# Patient Record
Sex: Male | Born: 1960 | Race: White | Hispanic: No | Marital: Married | Smoking: Former smoker
Health system: Southern US, Community
[De-identification: ages and names within clinical notes are randomized; demographics above are authoritative.]

## PROBLEM LIST (undated history)

## (undated) DIAGNOSIS — F419 Anxiety disorder, unspecified: Secondary | ICD-10-CM

## (undated) DIAGNOSIS — F32A Depression, unspecified: Secondary | ICD-10-CM

## (undated) DIAGNOSIS — I509 Heart failure, unspecified: Secondary | ICD-10-CM

---

## 2020-02-06 ENCOUNTER — Ambulatory Visit (INDEPENDENT_AMBULATORY_CARE_PROVIDER_SITE_OTHER): Payer: Self-pay

## 2020-02-06 ENCOUNTER — Encounter: Payer: Self-pay | Admitting: Emergency Medicine

## 2020-02-06 ENCOUNTER — Ambulatory Visit
Admission: EM | Admit: 2020-02-06 | Discharge: 2020-02-06 | Disposition: A | Discharge status: Home / Self Care | Payer: Self-pay

## 2020-02-06 DIAGNOSIS — W540XXA Bitten by dog, initial encounter: Secondary | ICD-10-CM

## 2020-02-06 DIAGNOSIS — L03116 Cellulitis of left lower limb: Secondary | ICD-10-CM

## 2020-02-06 DIAGNOSIS — M79605 Pain in left leg: Secondary | ICD-10-CM

## 2020-02-06 DIAGNOSIS — M25562 Pain in left knee: Secondary | ICD-10-CM

## 2020-02-06 HISTORY — DX: Heart failure, unspecified: I50.9

## 2020-02-06 IMAGING — DX DG KNEE 1-2V*L*
2 series · 2 of 2 positions shown · non-contrast
Comparison: None.

CLINICAL DATA: Dog bite on [REDACTED], infection

EXAM:
LEFT KNEE - 1-2 VIEW

[knee ap]
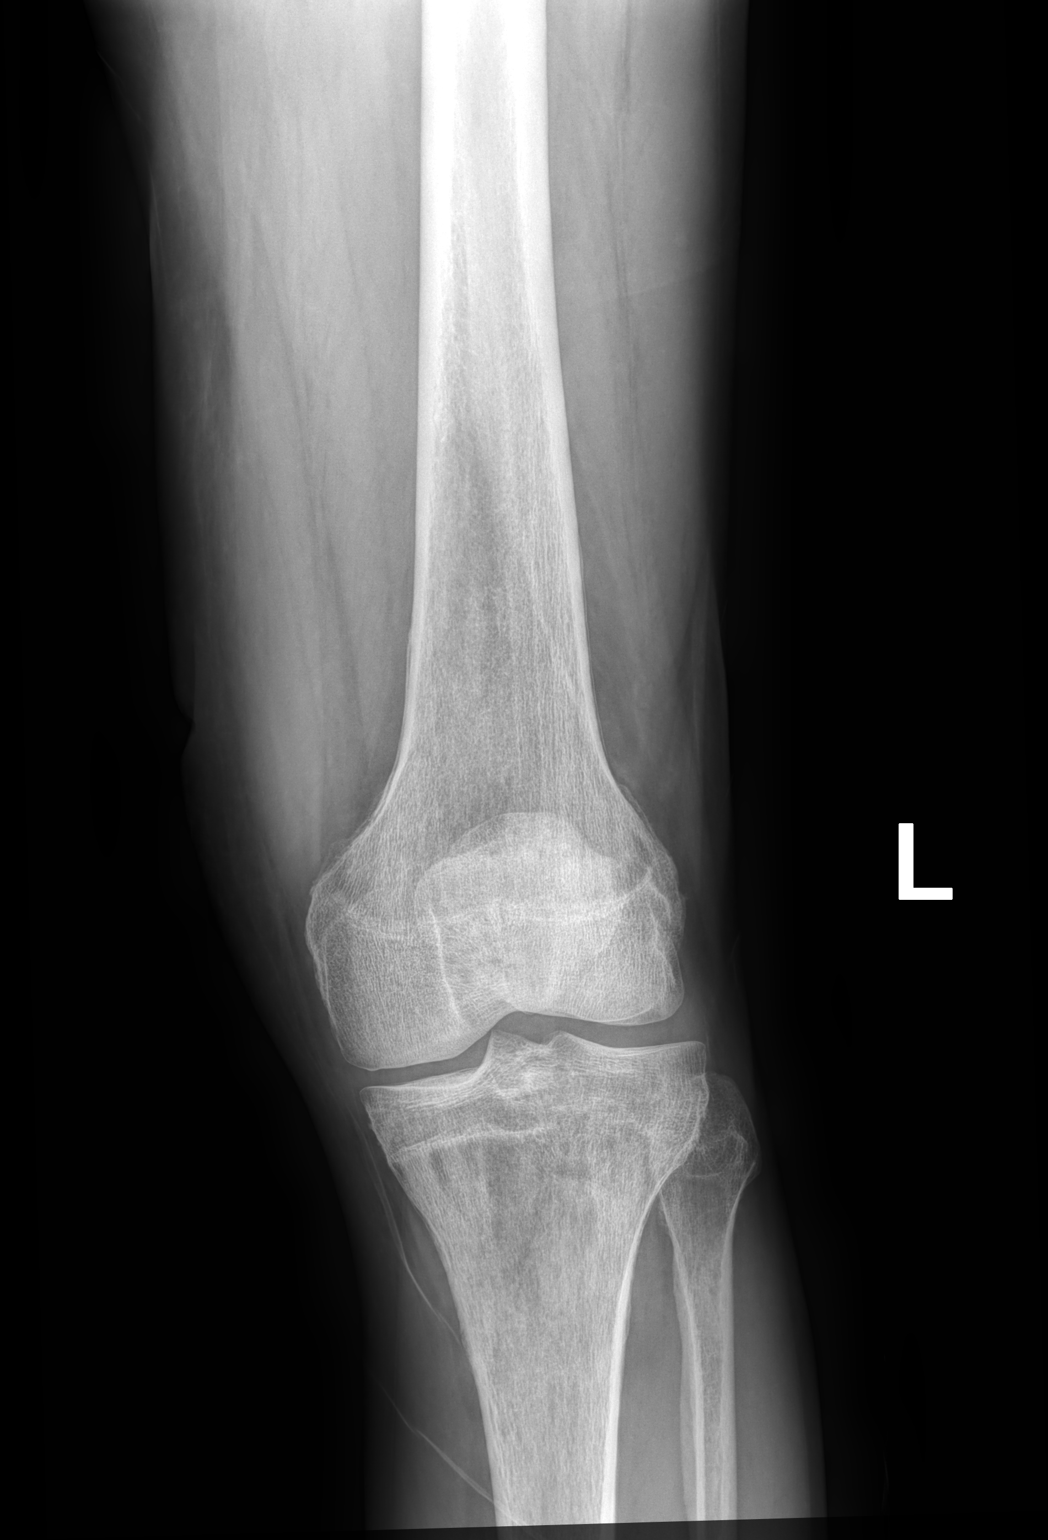

[knee lat]
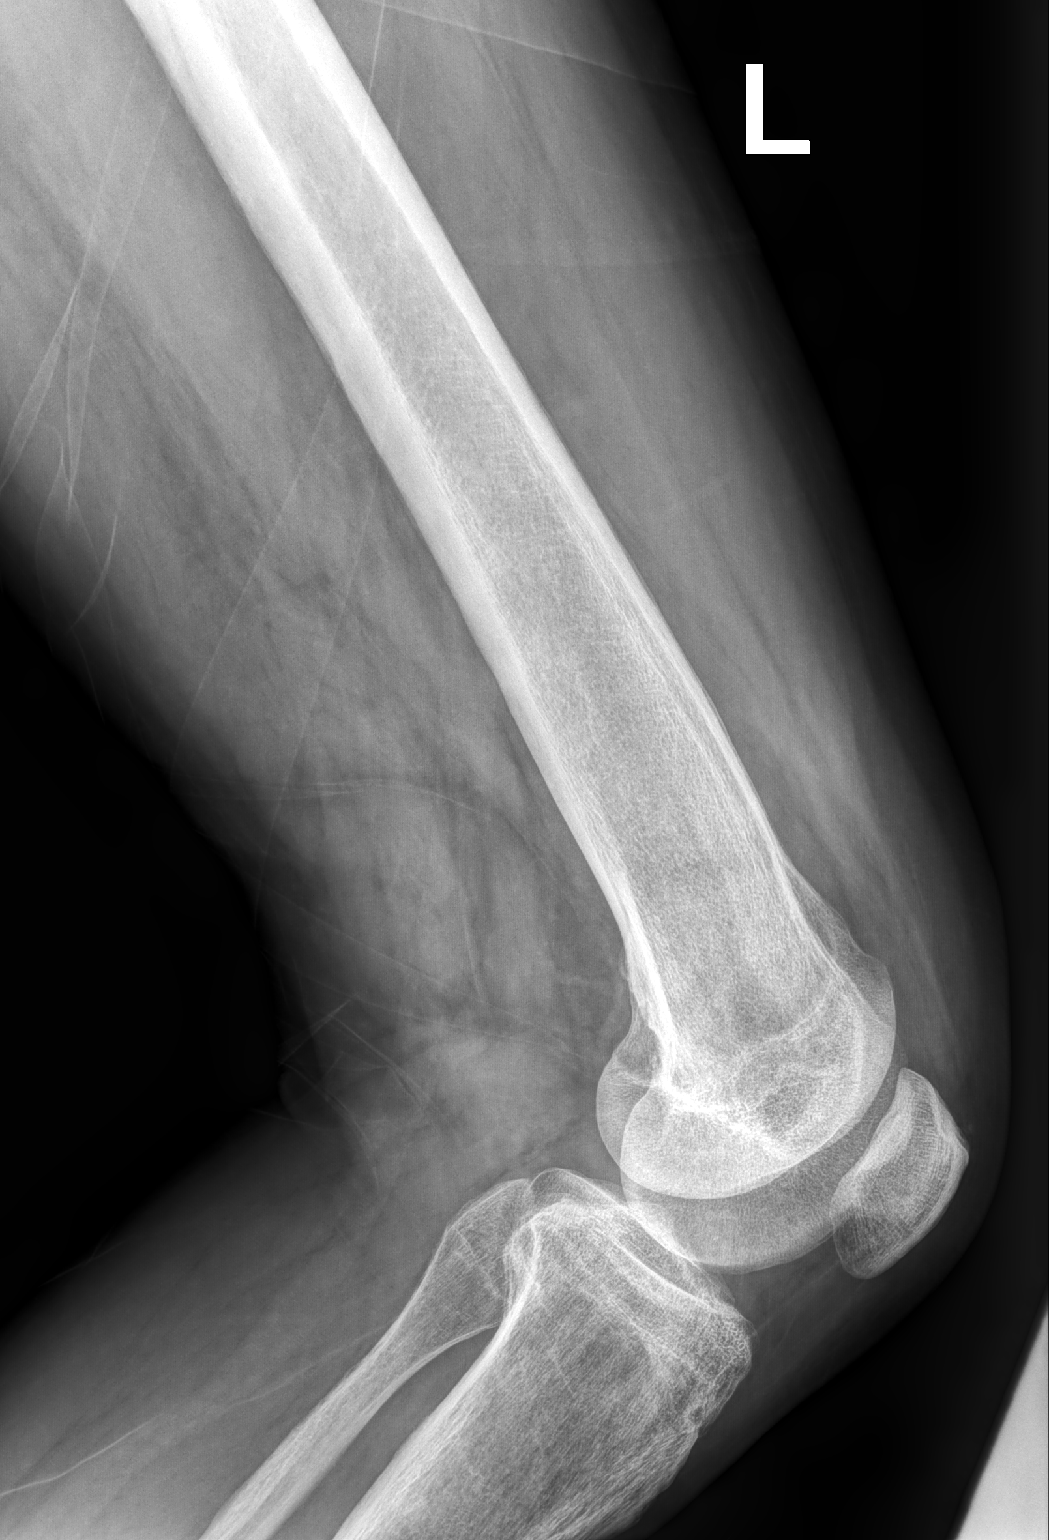

[2 of 2 positions shown; findings below may reference images not displayed]

FINDINGS: Frontal and lateral views of the left knee demonstrate no fracture,
subluxation, or dislocation. Joint spaces are well preserved. No
effusion.

Subcutaneous gas is seen surrounding the distal left femur, most
pronounced posteriorly on the lateral view. No radiopaque foreign
body.
IMPRESSION: 1. Subcutaneous gas and distal left thigh, consistent with history
of penetrating trauma. No radiopaque foreign body.
2. No acute fracture.

## 2020-02-06 MED ORDER — CEFTRIAXONE SODIUM 1 G IJ SOLR
2.0000 g | Freq: Once | INTRAMUSCULAR | Status: AC
  Administered 2020-02-06: 16:00:00 2 g via INTRAMUSCULAR

## 2020-02-06 MED ORDER — AMOXICILLIN-POT CLAVULANATE 875-125 MG PO TABS: 1.0000 | ORAL_TABLET | Freq: Two times a day (BID) | ORAL | 0 refills | Status: AC

## 2020-02-06 NOTE — Discharge Instructions (Addendum)
You have received a large dose of Rocephin in the office today. You should begin to see an improvement in your wound over the next 24 hours.  I have sent in Augmentin for you to take twice a day for 10 days.   If you are not improving by Sunday morning, follow up in this office for additional antibiotic injection.  If you are getting worse, you develop a fever, red streaking up your leg, chills, shortness of breath or other concerning symptoms, I want you to go to the ER for further evaluation and treatment.

## 2020-02-06 NOTE — ED Provider Notes (Signed)
[facility]    CSN: [id number] Arrival date & time: 02/06/20  1353      History   Chief Complaint Chief Complaint  Patient presents with  . Wound Infection    HPI Todd [name] is a 59 y.o. male.    Todd [name] is a 59 y.o. male who presents with a skin complaint that began x 5 days ago. Reports that he was bitten by his dog. Reports that the bite is located above the L knee. Reports that the area is red, hot and painful. Reports that the wound is draining yellow and green fluid, has some black debris in the middle, and has a foul smell. Has tried cleaning the wound at home this week with saline, dermoplast, neosporin creams. Has tried to keep the area clean and dry with little success. There are no aggravating or alleviating factors. Denies fever, chills, nausea, vomiting, oral lesions, SOB, chest pain, abdominal pain, changes in bowel or bladder function.    ROS per HPI   The history is provided by the patient.    Past Medical History:  Diagnosis Date  . CHF (congestive heart failure) (HCC)     There are no problems to display for this patient.   History reviewed. No pertinent surgical history.     Home Medications    Prior to Admission medications   Medication Sig Start Date End Date Taking? Authorizing Provider  amoxicillin-clavulanate (AUGMENTIN) 875-125 MG tablet Take 1 tablet by mouth 2 (two) times daily for 10 days. 02/06/20 02/16/20  Moshe Cipro, NP  aspirin EC 81 MG tablet Take 81 mg by mouth daily. Swallow whole.    [provider]  finasteride (PROSCAR) 5 MG tablet Take 5 mg by mouth daily.    [provider]  losartan (COZAAR) 25 MG tablet Take 25 mg by mouth daily.    [provider]  metoprolol tartrate (LOPRESSOR) 50 MG tablet Take 50 mg by mouth 2 (two) times daily.    [provider]  spironolactone (ALDACTONE) 25 MG tablet Take 25 mg by mouth daily.    [provider]   torsemide (DEMADEX) 20 MG tablet Take 20 mg by mouth daily.    [provider]    Family History No family history on file.  Social History Social History   Tobacco Use  . Smoking status: Former Games developer  . Smokeless tobacco: Never Used  Substance Use Topics  . Alcohol use: Never  . Drug use: Never     Allergies   Patient has no known allergies.   Review of Systems Review of Systems   Physical Exam Triage Vital Signs ED Triage Vitals  Enc Vitals Group     BP 02/06/20 1420 116/79     Pulse Rate 02/06/20 1420 87     Resp 02/06/20 1420 18     Temp 02/06/20 1420 98.4 F (36.9 C)     Temp Source 02/06/20 1420 Oral     SpO2 02/06/20 1420 94 %     Weight 02/06/20 1416 190 lb (86.2 kg)     Height 02/06/20 1416 5\' 11"  (1.803 m)     Head Circumference --      Peak Flow --      Pain Score 02/06/20 1416 0     Pain Loc --      Pain Edu? --      Excl. in [place]? --    No data found.  Updated Vital Signs BP 116/79 (BP  Location: Right Arm)   Pulse 87   Temp 98.4 F (36.9 C) (Oral)   Resp 18   Ht 5\' 11"  (1.803 m)   Wt 290 lb (131.5 kg)   SpO2 94%   BMI 40.45 kg/m   Visual Acuity Right Eye Distance:   Left Eye Distance:   Bilateral Distance:    Right Eye Near:   Left Eye Near:    Bilateral Near:     Physical Exam Vitals and nursing note reviewed.  Constitutional:      General: He is not in acute distress.    Appearance: Normal appearance. He is well-developed and well-nourished. He is not ill-appearing.  HENT:     Head: Normocephalic and atraumatic.  Eyes:     Conjunctiva/sclera: Conjunctivae normal.  Cardiovascular:     Rate and Rhythm: Normal rate and regular rhythm.     Heart sounds: Normal heart sounds. No murmur heard.   Pulmonary:     Effort: Pulmonary effort is normal. No respiratory distress.     Breath sounds: Normal breath sounds.  Abdominal:     General: Bowel sounds are normal.     Palpations: Abdomen is soft.     Tenderness:  There is no abdominal tenderness.  Musculoskeletal:        General: No edema. Normal range of motion.     Cervical back: Normal range of motion and neck supple.  Skin:    General: Skin is warm and dry.     Capillary Refill: Capillary refill takes less than 2 seconds.     Findings: Wound present.       Neurological:     General: No focal deficit present.     Mental Status: He is alert and oriented to person, place, and time. Mental status is at baseline.  Psychiatric:        Mood and Affect: Mood and affect and mood normal.        Behavior: Behavior normal.        Thought Content: Thought content normal.      UC Treatments / Results  Labs (all labs ordered are listed, but only abnormal results are displayed) Labs Reviewed - No data to display  EKG   Radiology No results found.  Procedures Wound Care  Date/Time: 02/06/2020 2:55 PM Performed by: Faustino Congress, NP Authorized by: Faustino Congress, NP   Consent:    Consent obtained:  Verbal   Consent given by:  Patient   Risks, benefits, and alternatives were discussed: yes     Risks discussed:  Infection and pain   Alternatives discussed:  Alternative treatment Sedation:    Sedation type:  None Anesthesia:    Anesthesia method:  Topical application   Topical anesthetic:  LET Procedure details:    Indications: open wounds     Wound location:  Leg   Leg location:  L upper leg   Wound age (days):  5   Wound surface area (sq cm):  3   Debridement performed: Yes     Debridement type: selective     Debridement level: subcutaneous tissue     Debridement mechanism:  Forceps   Devitalized tissue debrided: slough   Dressing:    Dressing applied:  4x4 Post-procedure details:    Procedure completion:  Tolerated well, no immediate complications   (including critical care time)  Medications Ordered in UC Medications  cefTRIAXone (ROCEPHIN) injection 2 g (2 g Intramuscular Given 02/06/20 1612)    Initial  Impression /  Assessment and Plan / UC Course  I have reviewed the triage vital signs and the nursing notes.  Pertinent labs & imaging results that were available during my care of the patient were reviewed by me and considered in my medical decision making (see chart for details).     Left leg pain Cellulitis Dog bite  X-ray negative for osteomyelitis Attempted to debride wound in office today Minimal slough removed Patient tolerated very well 2 g of Rocephin IM given in office Prescribed Augmentin 875 twice daily x10 days Discussed with patient that if the area is not getting better over the next 24 hours, he needs to follow-up in the ER Discussed with patient that if the area is beginning to have red streaking up his leg, if the redness is swelling, if he develops fever, malaise, body aches, chills, he needs to follow-up in the ER as well If things are not getting worse, but are not really changing by Sunday when this office is open, he may follow-up here for reevaluation   Final Clinical Impressions(s) / UC Diagnoses   Final diagnoses:  Left leg pain  Cellulitis of left lower extremity  Dog bite, initial encounter     Discharge Instructions     You have received a large dose of Rocephin in the office today. You should begin to see an improvement in your wound over the next 24 hours.  I have sent in Augmentin for you to take twice a day for 10 days.   If you are not improving by Sunday morning, follow up in this office for additional antibiotic injection.  If you are getting worse, you develop a fever, red streaking up your leg, chills, shortness of breath or other concerning symptoms, I want you to go to the ER for further evaluation and treatment.    ED Prescriptions    Medication Sig Dispense Auth. Provider   amoxicillin-clavulanate (AUGMENTIN) 875-125 MG tablet Take 1 tablet by mouth 2 (two) times daily for 10 days. 20 tablet Faustino Congress, NP     PDMP not  reviewed this encounter.   Faustino Congress, NP 02/08/20 1523

## 2020-02-06 NOTE — ED Triage Notes (Signed)
Dog bite above LT knee from his dog.  Dog is up to date on rabies vaccine. Pt states wound is red, has pus draining and has foul smell

## 2020-12-03 ENCOUNTER — Other Ambulatory Visit: Payer: Self-pay

## 2020-12-03 ENCOUNTER — Ambulatory Visit
Admission: RE | Admit: 2020-12-03 | Discharge: 2020-12-03 | Disposition: A | Discharge status: Home / Self Care | Payer: Self-pay | Source: Ambulatory Visit | Attending: Family Medicine | Admitting: Family Medicine

## 2020-12-03 VITALS — BP 152/84 | HR 96 | Temp 98.9°F

## 2020-12-03 DIAGNOSIS — F411 Generalized anxiety disorder: Secondary | ICD-10-CM

## 2020-12-03 DIAGNOSIS — I10 Essential (primary) hypertension: Secondary | ICD-10-CM

## 2020-12-03 MED ORDER — LOSARTAN POTASSIUM 25 MG PO TABS: 25.0000 mg | ORAL_TABLET | Freq: Every day | ORAL | 2 refills | Status: DC

## 2020-12-03 MED ORDER — METOPROLOL SUCCINATE ER 50 MG PO TB24: 50.0000 mg | ORAL_TABLET | Freq: Every day | ORAL | 2 refills | Status: DC

## 2020-12-03 NOTE — ED Provider Notes (Signed)
[facility]    CSN: [id number] Arrival date & time: 12/03/20  9983      History   Chief Complaint Chief Complaint  Patient presents with   Medication Refill    HPI Todd [name] is a 60 y.o. male.   Patient presenting today requesting a blood pressure check and medication refills.  He states he is been out of his blood pressure medications for about a month and a half now.  Overall feeling well without chest pain, shortness of breath, leg swelling, visual changes, headaches and has not been monitoring his blood pressures at home.  States he is been on losartan and metoprolol for quite some time, cannot get into his primary care provider until the end of the year.  Has been under a lot of stress recently with worsening anxiety/panic attacks, recent death of his dog of 15 years, recent move to the area.  Feels like he has been having paranoia and anxiety attacks consistently, wife states that he cannot go outside by himself and has to take frequent moments of quiet breathing to help himself calm down from these episodes.   Past Medical History:  Diagnosis Date   CHF (congestive heart failure) (HCC)     There are no problems to display for this patient.   History reviewed. No pertinent surgical history.     Home Medications    Prior to Admission medications   Medication Sig Start Date End Date Taking? Authorizing Provider  metoprolol succinate (TOPROL XL) 50 MG 24 hr tablet Take 1 tablet (50 mg total) by mouth daily. Take with or immediately following a meal. 12/03/20  Yes Volney American, PA-C  aspirin EC 81 MG tablet Take 81 mg by mouth daily. Swallow whole.    [provider]  finasteride (PROSCAR) 5 MG tablet Take 5 mg by mouth daily.    [provider]  losartan (COZAAR) 25 MG tablet Take 1 tablet (25 mg total) by mouth daily. 12/03/20   Volney American, PA-C  metoprolol tartrate (LOPRESSOR) 50 MG tablet Take 50 mg by mouth  2 (two) times daily.    [provider]  spironolactone (ALDACTONE) 25 MG tablet Take 25 mg by mouth daily.    [provider]  torsemide (DEMADEX) 20 MG tablet Take 20 mg by mouth daily.    [provider]    Family History History reviewed. No pertinent family history.  Social History Social History   Tobacco Use   Smoking status: Former   Smokeless tobacco: Never  Substance Use Topics   Alcohol use: Never   Drug use: Never     Allergies   Patient has no known allergies.   Review of Systems Review of Systems Per HPI  Physical Exam Triage Vital Signs ED Triage Vitals  Enc Vitals Group     BP 12/03/20 1019 (!) 152/84     Pulse Rate 12/03/20 1019 96     Resp --      Temp 12/03/20 1019 98.9 F (37.2 C)     Temp src --      SpO2 12/03/20 1019 94 %     Weight --      Height --      Head Circumference --      Peak Flow --      Pain Score 12/03/20 1016 0     Pain Loc --      Pain Edu? --      Excl. in [place]? --  No data found.  Updated Vital Signs BP (!) 152/84   Pulse 96   Temp 98.9 F (37.2 C)   SpO2 94%   Visual Acuity Right Eye Distance:   Left Eye Distance:   Bilateral Distance:    Right Eye Near:   Left Eye Near:    Bilateral Near:     Physical Exam Vitals and nursing note reviewed.  Constitutional:      Appearance: Normal appearance.  HENT:     Head: Atraumatic.     Mouth/Throat:     Mouth: Mucous membranes are moist.  Eyes:     Extraocular Movements: Extraocular movements intact.     Conjunctiva/sclera: Conjunctivae normal.  Cardiovascular:     Rate and Rhythm: Normal rate and regular rhythm.  Pulmonary:     Effort: Pulmonary effort is normal. No respiratory distress.     Breath sounds: Normal breath sounds. No wheezing or rales.  Musculoskeletal:        General: No swelling. Normal range of motion.     Cervical back: Normal range of motion and neck supple.  Skin:    General: Skin is warm and dry.   Neurological:     General: No focal deficit present.     Mental Status: He is oriented to person, place, and time.     Motor: No weakness.     Gait: Gait normal.  Psychiatric:        Thought Content: Thought content normal.        Judgment: Judgment normal.     Comments: Appears anxious     UC Treatments / Results  Labs (all labs ordered are listed, but only abnormal results are displayed) Labs Reviewed - No data to display  EKG   Radiology No results found.  Procedures Procedures (including critical care time)  Medications Ordered in UC Medications - No data to display  Initial Impression / Assessment and Plan / UC Course  I have reviewed the triage vital signs and the nursing notes.  Pertinent labs & imaging results that were available during my care of the patient were reviewed by me and considered in my medical decision making (see chart for details).     We will restart losartan, metoprolol until he can see primary care provider for further evaluation, lab monitoring and recheck.  Discussed importance of monitoring home blood pressures to ensure under good control and to follow-up immediately with side effects, issues in the meantime.  Regarding his anxiousness, discussed to follow-up with primary care provider regarding worsening symptoms with this and to continue with breathing exercises, physical activity and possibly look into counseling in the meantime.  Return for acutely worsening symptoms.  Final Clinical Impressions(s) / UC Diagnoses   Final diagnoses:  Essential hypertension  Anxiety state   Discharge Instructions   None    ED Prescriptions     Medication Sig Dispense Auth. Provider   losartan (COZAAR) 25 MG tablet Take 1 tablet (25 mg total) by mouth daily. 30 tablet Volney American, [place]   metoprolol succinate (TOPROL XL) 50 MG 24 hr tablet Take 1 tablet (50 mg total) by mouth daily. Take with or immediately following a meal. 30 tablet  Volney American, [place]      PDMP not reviewed this encounter.   Volney American, [place] 12/03/20 1104

## 2020-12-03 NOTE — ED Triage Notes (Signed)
Pt is present today with for medication refill and blood pressure check. Pt states that he is also feeling nervous and paranoid. Pt thinks its due to the recent loss of his dog and moving to a new state

## 2021-03-01 ENCOUNTER — Other Ambulatory Visit: Payer: Self-pay | Admitting: Family Medicine

## 2021-03-01 NOTE — Telephone Encounter (Signed)
Requested Prescriptions  Refused Prescriptions Disp Refills   losartan (COZAAR) 25 MG tablet [Pharmacy Med Name: Losartan Potassium 25 MG Oral Tablet] 30 tablet 0    Sig: Take 1 tablet by mouth once daily     Cardiovascular:  Angiotensin Receptor Blockers Failed - 03/01/2021 12:28 PM      Failed - Cr in normal range and within 180 days    No results found for: CREATININE, LABCREAU, LABCREA, POCCRE       Failed - K in normal range and within 180 days    No results found for: K, POTASSIUM, POCK       Failed - Last BP in normal range    BP Readings from Last 1 Encounters:  12/03/20 (!) 152/84         Failed - Valid encounter within last 6 months    Recent Outpatient Visits   None     Future Appointments            In 2 weeks Lindell Spar, MD [place], [place] - Patient is not pregnant       metoprolol succinate (TOPROL-XL) 50 MG 24 hr tablet [Pharmacy Med Name: Metoprolol Succinate ER 50 MG Oral Tablet Extended Release 24 Hour] 90 tablet 0    Sig: TAKE 1 TABLET BY MOUTH ONCE DAILY. TAKE WITH OR IMMEDIATELY FOLLOWING A MEAL     Cardiovascular:  Beta Blockers Failed - 03/01/2021 12:28 PM      Failed - Last BP in normal range    BP Readings from Last 1 Encounters:  12/03/20 (!) 152/84         Failed - Valid encounter within last 6 months    Recent Outpatient Visits   None     Future Appointments            In 2 weeks Lindell Spar, MD [facility]           Passed - Last Heart Rate in normal range    Pulse Readings from Last 1 Encounters:  12/03/20 96

## 2021-03-03 ENCOUNTER — Other Ambulatory Visit: Payer: Self-pay | Admitting: Family Medicine

## 2021-03-07 ENCOUNTER — Other Ambulatory Visit: Payer: Self-pay

## 2021-03-07 ENCOUNTER — Ambulatory Visit
Admission: EM | Admit: 2021-03-07 | Discharge: 2021-03-07 | Disposition: A | Discharge status: Home / Self Care | Payer: Self-pay | Attending: Family Medicine | Admitting: Family Medicine

## 2021-03-07 DIAGNOSIS — I10 Essential (primary) hypertension: Secondary | ICD-10-CM

## 2021-03-07 MED ORDER — METOPROLOL SUCCINATE ER 50 MG PO TB24: 50.0000 mg | ORAL_TABLET | Freq: Every day | ORAL | 0 refills | Status: DC

## 2021-03-07 MED ORDER — LOSARTAN POTASSIUM 25 MG PO TABS: 25.0000 mg | ORAL_TABLET | Freq: Every day | ORAL | 0 refills | Status: DC

## 2021-03-07 NOTE — ED Triage Notes (Signed)
Patient states he needs Blood Pressure Refill  Patient states he has an appointment in 2 weeks

## 2021-03-07 NOTE — ED Provider Notes (Signed)
[facility]    CSN: [id number] Arrival date & time: 03/07/21  1049      History   Chief Complaint Chief Complaint  Patient presents with   Medication Refill    HPI Todd [name] is a 61 y.o. male.   Presenting today requesting a medication refill on metoprolol and losartan for his essential hypertension.  He states his blood pressures have been well controlled in the 120s over 80s range he has tolerated the medications well without side effects.  Denies chest pain, shortness of breath, headaches, dizziness, visual changes.  Has an establish care visit scheduled for 2 weeks from now with a new primary care provider in the area and just needs a refill until then.  No new questions or concerns today.   Past Medical History:  Diagnosis Date   CHF (congestive heart failure) (HCC)     There are no problems to display for this patient.   History reviewed. No pertinent surgical history.     Home Medications    Prior to Admission medications   Medication Sig Start Date End Date Taking? Authorizing Provider  aspirin EC 81 MG tablet Take 81 mg by mouth daily. Swallow whole.    [provider]  finasteride (PROSCAR) 5 MG tablet Take 5 mg by mouth daily.    [provider]  losartan (COZAAR) 25 MG tablet Take 1 tablet (25 mg total) by mouth daily. 03/07/21   Volney American, PA-C  metoprolol succinate (TOPROL XL) 50 MG 24 hr tablet Take 1 tablet (50 mg total) by mouth daily. Take with or immediately following a meal. 03/07/21   Volney American, PA-C  metoprolol tartrate (LOPRESSOR) 50 MG tablet Take 50 mg by mouth 2 (two) times daily.    [provider]  spironolactone (ALDACTONE) 25 MG tablet Take 25 mg by mouth daily.    [provider]  torsemide (DEMADEX) 20 MG tablet Take 20 mg by mouth daily.    [provider]    Family History Family History  Problem Relation Age of Onset   Cancer Mother    Dementia  Father     Social History Social History   Tobacco Use   Smoking status: Former   Smokeless tobacco: Never  Scientific laboratory technician Use: Every day   Substances: Nicotine, Flavoring  Substance Use Topics   Alcohol use: Never   Drug use: Never     Allergies   Patient has no known allergies.   Review of Systems Review of Systems Per HPI  Physical Exam Triage Vital Signs ED Triage Vitals  Enc Vitals Group     BP 03/07/21 1151 126/81     Pulse Rate 03/07/21 1151 82     Resp 03/07/21 1151 18     Temp 03/07/21 1151 99.1 F (37.3 C)     Temp Source 03/07/21 1151 Oral     SpO2 03/07/21 1151 95 %     Weight --      Height --      Head Circumference --      Peak Flow --      Pain Score 03/07/21 1147 0     Pain Loc --      Pain Edu? --      Excl. in [place]? --    No data found.  Updated Vital Signs BP 126/81 (BP Location: Right Arm)    Pulse 82    Temp 99.1 F (37.3 C) (Oral)  Resp 18    SpO2 95%   Visual Acuity Right Eye Distance:   Left Eye Distance:   Bilateral Distance:    Right Eye Near:   Left Eye Near:    Bilateral Near:     Physical Exam Vitals and nursing note reviewed.  Constitutional:      Appearance: Normal appearance.  HENT:     Head: Atraumatic.     Mouth/Throat:     Mouth: Mucous membranes are moist.  Eyes:     Extraocular Movements: Extraocular movements intact.     Conjunctiva/sclera: Conjunctivae normal.  Cardiovascular:     Rate and Rhythm: Normal rate and regular rhythm.  Pulmonary:     Effort: Pulmonary effort is normal.     Breath sounds: Normal breath sounds. No wheezing or rales.  Musculoskeletal:        General: Normal range of motion.     Cervical back: Normal range of motion and neck supple.  Skin:    General: Skin is warm and dry.  Neurological:     General: No focal deficit present.     Mental Status: He is oriented to person, place, and time.  Psychiatric:        Mood and Affect: Mood normal.        Thought Content:  Thought content normal.        Judgment: Judgment normal.   UC Treatments / Results  Labs (all labs ordered are listed, but only abnormal results are displayed) Labs Reviewed - No data to display  EKG   Radiology No results found.  Procedures Procedures (including critical care time)  Medications Ordered in UC Medications - No data to display  Initial Impression / Assessment and Plan / UC Course  I have reviewed the triage vital signs and the nursing notes.  Pertinent labs & imaging results that were available during my care of the patient were reviewed by me and considered in my medical decision making (see chart for details).     Vitals and exam benign and reassuring today, tolerating the medications well with good control of her blood pressures.  Will refill both medications until he can follow-up with his new primary care provider.  Return for any worsening symptoms at any time.  Final Clinical Impressions(s) / UC Diagnoses   Final diagnoses:  Essential hypertension   Discharge Instructions   None    ED Prescriptions     Medication Sig Dispense Auth. Provider   losartan (COZAAR) 25 MG tablet Take 1 tablet (25 mg total) by mouth daily. 30 tablet Volney American, [place]   metoprolol succinate (TOPROL XL) 50 MG 24 hr tablet Take 1 tablet (50 mg total) by mouth daily. Take with or immediately following a meal. 30 tablet Volney American, [place]      PDMP not reviewed this encounter.   Volney American, [place] 03/07/21 1226

## 2021-03-21 ENCOUNTER — Encounter: Payer: Self-pay | Admitting: Internal Medicine

## 2021-03-21 ENCOUNTER — Other Ambulatory Visit: Payer: Self-pay

## 2021-03-21 ENCOUNTER — Ambulatory Visit (INDEPENDENT_AMBULATORY_CARE_PROVIDER_SITE_OTHER): Payer: Self-pay | Admitting: Internal Medicine

## 2021-03-21 VITALS — BP 124/82 | HR 90 | Resp 18 | Ht 70.0 in | Wt 161.0 lb

## 2021-03-21 DIAGNOSIS — F1021 Alcohol dependence, in remission: Secondary | ICD-10-CM

## 2021-03-21 DIAGNOSIS — Z114 Encounter for screening for human immunodeficiency virus [HIV]: Secondary | ICD-10-CM

## 2021-03-21 DIAGNOSIS — F321 Major depressive disorder, single episode, moderate: Secondary | ICD-10-CM

## 2021-03-21 DIAGNOSIS — Z1159 Encounter for screening for other viral diseases: Secondary | ICD-10-CM

## 2021-03-21 DIAGNOSIS — I509 Heart failure, unspecified: Secondary | ICD-10-CM

## 2021-03-21 DIAGNOSIS — I10 Essential (primary) hypertension: Secondary | ICD-10-CM | POA: Insufficient documentation

## 2021-03-21 DIAGNOSIS — Z2821 Immunization not carried out because of patient refusal: Secondary | ICD-10-CM

## 2021-03-21 DIAGNOSIS — E782 Mixed hyperlipidemia: Secondary | ICD-10-CM

## 2021-03-21 DIAGNOSIS — R7303 Prediabetes: Secondary | ICD-10-CM

## 2021-03-21 MED ORDER — LOSARTAN POTASSIUM 25 MG PO TABS: 25.0000 mg | ORAL_TABLET | Freq: Every day | ORAL | 1 refills | Status: DC

## 2021-03-21 MED ORDER — METOPROLOL SUCCINATE ER 50 MG PO TB24: 50.0000 mg | ORAL_TABLET | Freq: Every day | ORAL | 1 refills | Status: DC

## 2021-03-21 MED ORDER — ESCITALOPRAM OXALATE 5 MG PO TABS: 5.0000 mg | ORAL_TABLET | Freq: Every day | ORAL | 1 refills | Status: DC

## 2021-03-21 NOTE — Patient Instructions (Addendum)
Please continue taking medications as prescribed.  Please continue to follow low salt diet and ambulate as tolerated.  Please get fasting blood tests within a week.

## 2021-03-25 DIAGNOSIS — F321 Major depressive disorder, single episode, moderate: Secondary | ICD-10-CM | POA: Insufficient documentation

## 2021-03-25 NOTE — Progress Notes (Signed)
New Patient Office Visit  Subjective:  Patient ID: Todd [name], male    DOB: [birth year]  Age: 61 y.o. MRN: [id number]  CC:  Chief Complaint  Patient presents with   New Patient (Initial Visit)    New patient just moved here about 1 year ago has depression and anxiety on and off this has been going on for about 3 months was on medication but quit going to dr in diff state    HPI Todd [name] is a 61 y.o. male with past medical history of HTN, CHF and depression with anxiety who presents for establishing care.  His wife is present during the visit.  They have moved from [place].  HTN and CHF: He was diagnosed with acute CHF when he was in [place].  He was having exertional dyspnea and leg swelling, and was placed on losartan, metoprolol, Aldactone and torsemide for it.  He currently takes only losartan and metoprolol since moving to [place].  He was given losartan and metoprolol from ER/Urgent care.  He currently denies any dyspnea or leg swelling.  Denies any headache, dizziness or chest pain currently.  He was told that his CHF was due to his alcohol dependence, and he has stopped drinking alcohol since then.  Depression with anxiety: He lost his cat recently, and has been having anhedonia and severe anxiety since then.  He does not let his wife go to work due to severe anxiety while he is alone at home.  He also has stopped going outdoors.  He denies any SI or HI currently.  Past Medical History:  Diagnosis Date   CHF (congestive heart failure) ([place])     History reviewed. No pertinent surgical history.  Family History  Problem Relation Age of Onset   Cancer Mother    Dementia Father     Social History   Socioeconomic History   Marital status: Married    Spouse name: Not on file   Number of children: Not on file   Years of education: Not on file   Highest education level: Not on file  Occupational History   Not on file  Tobacco Use   Smoking status: Former   Smokeless tobacco:  Never  Vaping Use   Vaping Use: Every day   Substances: Nicotine, Flavoring  Substance and Sexual Activity   Alcohol use: Never   Drug use: Never   Sexual activity: Yes    Birth control/protection: None  Other Topics Concern   Not on file  Social History Narrative   Not on file   Social Determinants of Health   Financial Resource Strain: Not on file  Food Insecurity: Not on file  Transportation Needs: Not on file  Physical Activity: Not on file  Stress: Not on file  Social Connections: Not on file  Intimate Partner Violence: Not on file    ROS Review of Systems  Constitutional:  Negative for chills and fever.  HENT:  Negative for congestion and sore throat.   Eyes:  Negative for pain and discharge.  Respiratory:  Negative for cough and shortness of breath.   Cardiovascular:  Negative for chest pain and palpitations.  Gastrointestinal:  Negative for diarrhea, nausea and vomiting.  Endocrine: Negative for polydipsia and polyuria.  Genitourinary:  Negative for dysuria and hematuria.  Musculoskeletal:  Negative for neck pain and neck stiffness.  Skin:  Negative for rash.  Neurological:  Negative for dizziness, weakness, numbness and headaches.  Psychiatric/Behavioral:  Positive for decreased concentration, dysphoric mood  and sleep disturbance. Negative for agitation and behavioral problems. The patient is nervous/anxious.    Objective:   Today's Vitals: BP 124/82 (BP Location: Right Arm, Patient Position: Sitting, Cuff Size: Normal)    Pulse 90    Resp 18    Ht 5\' 10"  (1.778 m)    Wt 161 lb (73 kg)    SpO2 97%    BMI 23.10 kg/m   Physical Exam Vitals reviewed.  Constitutional:      General: He is not in acute distress.    Appearance: He is not diaphoretic.  HENT:     Head: Normocephalic and atraumatic.     Nose: Nose normal.     Mouth/Throat:     Mouth: Mucous membranes are moist.  Eyes:     General: No scleral icterus.    Extraocular Movements: Extraocular  movements intact.  Cardiovascular:     Rate and Rhythm: Normal rate and regular rhythm.     Pulses: Normal pulses.     Heart sounds: Normal heart sounds. No murmur heard. Pulmonary:     Breath sounds: Normal breath sounds. No wheezing or rales.  Abdominal:     Palpations: Abdomen is soft.     Tenderness: There is no abdominal tenderness.  Musculoskeletal:     Cervical back: Neck supple. No tenderness.     Right lower leg: No edema.     Left lower leg: No edema.  Skin:    General: Skin is warm.     Findings: No rash.  Neurological:     General: No focal deficit present.     Mental Status: He is alert and oriented to person, place, and time.  Psychiatric:        Mood and Affect: Mood is depressed. Affect is tearful.        Behavior: Behavior is slowed.    Assessment & Plan:   Problem List Items Addressed This Visit       Cardiovascular and Mediastinum   Essential hypertension - Primary   Relevant Medications   losartan (COZAAR) 25 MG tablet   metoprolol succinate (TOPROL XL) 50 MG 24 hr tablet   Other Relevant Orders   CMP14+EGFR   CHF (congestive heart failure) (HCC)   Relevant Medications   losartan (COZAAR) 25 MG tablet   metoprolol succinate (TOPROL XL) 50 MG 24 hr tablet   Other Relevant Orders   Ambulatory referral to Cardiology     Other   Alcohol dependence in remission [facility])   Other Visit Diagnoses     Influenza vaccine refused       Need for hepatitis C screening test       Relevant Orders   Hepatitis C Antibody   Encounter for screening for HIV       Relevant Orders   HIV antibody (with reflex)   Mixed hyperlipidemia       Relevant Medications   losartan (COZAAR) 25 MG tablet   metoprolol succinate (TOPROL XL) 50 MG 24 hr tablet   Other Relevant Orders   Lipid Profile   Prediabetes       Relevant Orders   Hemoglobin A1c   Current moderate episode of major depressive disorder without prior episode (HCC)       Relevant Medications    escitalopram (LEXAPRO) 5 MG tablet       Outpatient Encounter Medications as of 03/21/2021  Medication Sig   aspirin EC 81 MG tablet Take 81 mg by mouth daily. Swallow whole.  escitalopram (LEXAPRO) 5 MG tablet Take 1 tablet (5 mg total) by mouth daily.   [DISCONTINUED] losartan (COZAAR) 25 MG tablet Take 1 tablet (25 mg total) by mouth daily.   [DISCONTINUED] metoprolol succinate (TOPROL XL) 50 MG 24 hr tablet Take 1 tablet (50 mg total) by mouth daily. Take with or immediately following a meal.   losartan (COZAAR) 25 MG tablet Take 1 tablet (25 mg total) by mouth daily.   metoprolol succinate (TOPROL XL) 50 MG 24 hr tablet Take 1 tablet (50 mg total) by mouth daily. Take with or immediately following a meal.   [DISCONTINUED] finasteride (PROSCAR) 5 MG tablet Take 5 mg by mouth daily.   [DISCONTINUED] metoprolol tartrate (LOPRESSOR) 50 MG tablet Take 50 mg by mouth 2 (two) times daily.   [DISCONTINUED] spironolactone (ALDACTONE) 25 MG tablet Take 25 mg by mouth daily.   [DISCONTINUED] torsemide (DEMADEX) 20 MG tablet Take 20 mg by mouth daily.   No facility-administered encounter medications on file as of 03/21/2021.    Follow-up: Return in about 2 months (around 05/19/2021).   Lindell Spar, MD

## 2021-03-25 NOTE — Assessment & Plan Note (Signed)
BP Readings from Last 1 Encounters:  03/21/21 124/82   Well-controlled Counseled for compliance with the medications Advised DASH diet and moderate exercise/walking, at least 150 mins/week

## 2021-03-25 NOTE — Assessment & Plan Note (Signed)
Denies heavy/binge alcohol drink now

## 2021-03-25 NOTE — Assessment & Plan Note (Addendum)
[place] Office Visit from 03/21/2021 in [facility]  PHQ-9 Total Score 5     Has been depressed since losing his cat Has had adjustment issues since moving from [place] for depression with anxiety Advised to engage in outdoor activities

## 2021-03-25 NOTE — Assessment & Plan Note (Signed)
Appears euvolemic currently On Losartan and Metoprolol Referred to Cardiology

## 2021-05-19 ENCOUNTER — Other Ambulatory Visit: Payer: Self-pay | Admitting: *Deleted

## 2021-05-19 ENCOUNTER — Telehealth: Payer: Self-pay

## 2021-05-19 ENCOUNTER — Ambulatory Visit: Payer: Self-pay | Admitting: Internal Medicine

## 2021-05-19 DIAGNOSIS — F321 Major depressive disorder, single episode, moderate: Secondary | ICD-10-CM

## 2021-05-19 MED ORDER — ESCITALOPRAM OXALATE 5 MG PO TABS: 5.0000 mg | ORAL_TABLET | Freq: Every day | ORAL | 1 refills | Status: DC

## 2021-05-19 NOTE — Telephone Encounter (Signed)
Patient spouse called need med refill, will be out before Monday. ?escitalopram (LEXAPRO) 5 MG tablet  ? ? ?Pharm[facility]: [facility] ?

## 2021-05-19 NOTE — Telephone Encounter (Signed)
Pt medication sent to pharmacy  

## 2021-05-23 ENCOUNTER — Ambulatory Visit (INDEPENDENT_AMBULATORY_CARE_PROVIDER_SITE_OTHER): Payer: Self-pay | Admitting: Internal Medicine

## 2021-05-23 ENCOUNTER — Encounter: Payer: Self-pay | Admitting: Internal Medicine

## 2021-05-23 VITALS — BP 112/76 | HR 82 | Resp 18 | Ht 70.5 in | Wt 159.2 lb

## 2021-05-23 DIAGNOSIS — R42 Dizziness and giddiness: Secondary | ICD-10-CM

## 2021-05-23 DIAGNOSIS — I509 Heart failure, unspecified: Secondary | ICD-10-CM

## 2021-05-23 DIAGNOSIS — F321 Major depressive disorder, single episode, moderate: Secondary | ICD-10-CM

## 2021-05-23 DIAGNOSIS — I10 Essential (primary) hypertension: Secondary | ICD-10-CM

## 2021-05-23 MED ORDER — ESCITALOPRAM OXALATE 10 MG PO TABS: 10.0000 mg | ORAL_TABLET | Freq: Every day | ORAL | 3 refills | Status: DC

## 2021-05-23 NOTE — Assessment & Plan Note (Signed)
BP Readings from Last 1 Encounters:  ?05/23/21 112/76  ? ?Well-controlled ?Counseled for compliance with the medications ?Advised DASH diet and moderate exercise/walking, at least 150 mins/week ?

## 2021-05-23 NOTE — Assessment & Plan Note (Signed)
Isolated episode could be due to dehydration, advised to maintain adequate hydration for now ?Avoid skipping any meals ?Meclizine as needed for dizziness ?Needs to follow-up with cardiology to get echo ?

## 2021-05-23 NOTE — Assessment & Plan Note (Signed)
Appears euvolemic currently ?On Losartan and Metoprolol ?Referred to Cardiology ?

## 2021-05-23 NOTE — Progress Notes (Signed)
? ?Established Patient Office Visit ? ?Subjective:  ?Patient ID: Todd [name], male    DOB: 08/28/1960  Age: 61 y.o. MRN: [id number] ? ?CC:  ?Chief Complaint  ?Patient presents with  ? Follow-up  ?  2 month follow up   ? ? ?HPI ?Todd [name] is a 62 y.o. male with past medical history of  HTN, CHF and depression with anxiety who presents for f/u of his chronic medical conditions.  His wife is present during the visit. ? ?HTN and CHF: He was diagnosed with acute CHF when he was in [place].  He was having exertional dyspnea and leg swelling, and was placed on losartan, metoprolol, Aldactone and torsemide for it.  He currently takes only losartan and metoprolol since moving to [place]. He currently denies any dyspnea or leg swelling.  Denies any headache, dizziness or chest pain currently.  He was told that his CHF was due to his alcohol dependence, and he has stopped drinking alcohol since then.  He has not been able to see cardiologist yet due to insurance concerns. ? ?He reports an episode of dizziness when he went to a grocery store, but denies any chest pain, dyspnea, palpitations or leg swelling. Denies skipping any meals.  Denies any ear pain or discharge currently. ? ?Depression with anxiety: He still complains of anhedonia and insomnia, but is better now with Lexapro.  He denies any SI or HI currently. ? ? ? ?Past Medical History:  ?Diagnosis Date  ? CHF (congestive heart failure) ([place])   ? ? ?History reviewed. No pertinent surgical history. ? ?Family History  ?Problem Relation Age of Onset  ? Cancer Mother   ? Dementia Father   ? ? ?Social History  ? ?Socioeconomic History  ? Marital status: Married  ?  Spouse name: Not on file  ? Number of children: Not on file  ? Years of education: Not on file  ? Highest education level: Not on file  ?Occupational History  ? Not on file  ?Tobacco Use  ? Smoking status: Former  ? Smokeless tobacco: Never  ?Vaping Use  ? Vaping Use: Every day  ? Substances: Nicotine, Flavoring   ?Substance and Sexual Activity  ? Alcohol use: Never  ? Drug use: Never  ? Sexual activity: Yes  ?  Birth control/protection: None  ?Other Topics Concern  ? Not on file  ?Social History Narrative  ? Not on file  ? ?Social Determinants of Health  ? ?Financial Resource Strain: Not on file  ?Food Insecurity: Not on file  ?Transportation Needs: Not on file  ?Physical Activity: Not on file  ?Stress: Not on file  ?Social Connections: Not on file  ?Intimate Partner Violence: Not on file  ? ? ?Outpatient Medications Prior to Visit  ?Medication Sig Dispense Refill  ? aspirin EC 81 MG tablet Take 81 mg by mouth daily. Swallow whole.    ? losartan (COZAAR) 25 MG tablet Take 1 tablet (25 mg total) by mouth daily. 90 tablet 1  ? metoprolol succinate (TOPROL XL) 50 MG 24 hr tablet Take 1 tablet (50 mg total) by mouth daily. Take with or immediately following a meal. 90 tablet 1  ? escitalopram (LEXAPRO) 5 MG tablet Take 1 tablet (5 mg total) by mouth daily. 30 tablet 1  ? ?No facility-administered medications prior to visit.  ? ? ?No Known Allergies ? ?ROS ?Review of Systems  ?Constitutional:  Negative for chills and fever.  ?HENT:  Negative for congestion and sore throat.   ?  Eyes:  Negative for pain and discharge.  ?Respiratory:  Negative for cough and shortness of breath.   ?Cardiovascular:  Negative for chest pain and palpitations.  ?Gastrointestinal:  Negative for diarrhea, nausea and vomiting.  ?Endocrine: Negative for polydipsia and polyuria.  ?Genitourinary:  Negative for dysuria and hematuria.  ?Musculoskeletal:  Negative for neck pain and neck stiffness.  ?Skin:  Negative for rash.  ?Neurological:  Positive for dizziness. Negative for weakness, numbness and headaches.  ?Psychiatric/Behavioral:  Positive for decreased concentration, dysphoric mood and sleep disturbance. Negative for agitation and behavioral problems. The patient is nervous/anxious.   ? ?  ?Objective:  ?  ?Physical Exam ?Vitals reviewed.  ?Constitutional:    ?   General: He is not in acute distress. ?   Appearance: He is not diaphoretic.  ?HENT:  ?   Head: Normocephalic and atraumatic.  ?   Nose: Nose normal.  ?   Mouth/Throat:  ?   Mouth: Mucous membranes are moist.  ?Eyes:  ?   General: No scleral icterus. ?   Extraocular Movements: Extraocular movements intact.  ?Cardiovascular:  ?   Rate and Rhythm: Normal rate and regular rhythm.  ?   Pulses: Normal pulses.  ?   Heart sounds: Normal heart sounds. No murmur heard. ?Pulmonary:  ?   Breath sounds: Normal breath sounds. No wheezing or rales.  ?Abdominal:  ?   Palpations: Abdomen is soft.  ?   Tenderness: There is no abdominal tenderness.  ?Musculoskeletal:  ?   Cervical back: Neck supple. No tenderness.  ?   Right lower leg: No edema.  ?   Left lower leg: No edema.  ?Skin: ?   General: Skin is warm.  ?   Findings: No rash.  ?Neurological:  ?   General: No focal deficit present.  ?   Mental Status: He is alert and oriented to person, place, and time.  ?Psychiatric:     ?   Mood and Affect: Mood is depressed. Affect is tearful.     ?   Behavior: Behavior is slowed.  ? ? ?BP 112/76 (BP Location: Left Arm, Patient Position: Sitting, Cuff Size: Normal)   Pulse 82   Resp 18   Ht 5' 10.5" (1.791 m)   Wt 159 lb 3.2 oz (72.2 kg)   SpO2 98%   BMI 22.52 kg/m?  ?Wt Readings from Last 3 Encounters:  ?05/23/21 159 lb 3.2 oz (72.2 kg)  ?03/21/21 161 lb (73 kg)  ?02/06/20 290 lb (131.5 kg)  ? ? ?No results found for: TSH ?No results found for: WBC, HGB, HCT, MCV, PLT ?No results found for: NA, K, CHLORIDE, CO2, GLUCOSE, BUN, CREATININE, BILITOT, ALKPHOS, AST, ALT, PROT, ALBUMIN, CALCIUM, ANIONGAP, EGFR, GFR ?No results found for: CHOL ?No results found for: HDL ?No results found for: [place] ?No results found for: TRIG ?No results found for: CHOLHDL ?No results found for: HGBA1C ? ?  ?Assessment & Plan:  ? ?Problem List Items Addressed This Visit   ? ?  ? Cardiovascular and Mediastinum  ? Essential hypertension - Primary  ?   BP Readings from Last 1 Encounters:  ?05/23/21 112/76  ?Well-controlled ?Counseled for compliance with the medications ?Advised DASH diet and moderate exercise/walking, at least 150 mins/week ?  ?  ? CHF (congestive heart failure) ([place])  ?  Appears euvolemic currently ?On Losartan and Metoprolol ?Referred to Cardiology ?  ?  ?  ? Other  ? Current moderate episode of major depressive disorder without prior  episode ([place])  ?  [place] Office Visit from 03/21/2021 in [facility]  ?PHQ-9 Total Score 5  ?Has been depressed since losing his cat ?Has had adjustment issues since moving from [place] ?Increased dose of Lexapro to 10 mg QD -  for depression with anxiety ?Advised to engage in outdoor activities ?  ?  ? Relevant Medications  ? escitalopram (LEXAPRO) 10 MG tablet  ? Dizziness  ?  Isolated episode could be due to dehydration, advised to maintain adequate hydration for now ?Avoid skipping any meals ?Meclizine as needed for dizziness ?Needs to follow-up with cardiology to get echo ?  ?  ? ? ?Meds ordered this encounter  ?Medications  ? escitalopram (LEXAPRO) 10 MG tablet  ?  Sig: Take 1 tablet (10 mg total) by mouth daily.  ?  Dispense:  30 tablet  ?  Refill:  3  ?  Dose change  ? ? ?Follow-up: Return in about 6 months (around 11/22/2021) for Depression and HTN.  ? ? ?Lindell Spar, MD ?

## 2021-05-23 NOTE — Assessment & Plan Note (Signed)
[place] Office Visit from 03/21/2021 in [facility]  ?PHQ-9 Total Score 5  ?  ? ?Has been depressed since losing his cat ?Has had adjustment issues since moving from [place] ?Increased dose of Lexapro to 10 mg QD -  for depression with anxiety ?Advised to engage in outdoor activities ?

## 2021-05-23 NOTE — Patient Instructions (Signed)
Please start taking Lexapro 10 mg onc daily. Okay to take 2 tablets of 5 mg once daily until you complete current course. ? ?Please take at least 50 ounces of fluid in a day. Please avoid skipping any meals. ? ?Please take Meclizine as needed for dizziness. ?

## 2021-06-20 ENCOUNTER — Other Ambulatory Visit: Payer: Self-pay

## 2021-06-20 ENCOUNTER — Encounter (HOSPITAL_COMMUNITY): Payer: Self-pay

## 2021-06-20 ENCOUNTER — Emergency Department (HOSPITAL_COMMUNITY): Payer: Self-pay

## 2021-06-20 ENCOUNTER — Inpatient Hospital Stay (HOSPITAL_COMMUNITY)
Admission: EM | Admit: 2021-06-20 | Discharge: 2021-06-24 | DRG: 025 | Disposition: A | Discharge status: Home / Self Care | Payer: Self-pay | Attending: Neurosurgery | Admitting: Neurosurgery

## 2021-06-20 DIAGNOSIS — R42 Dizziness and giddiness: Secondary | ICD-10-CM

## 2021-06-20 DIAGNOSIS — R1310 Dysphagia, unspecified: Secondary | ICD-10-CM | POA: Diagnosis present

## 2021-06-20 DIAGNOSIS — D332 Benign neoplasm of brain, unspecified: Principal | ICD-10-CM

## 2021-06-20 DIAGNOSIS — I110 Hypertensive heart disease with heart failure: Secondary | ICD-10-CM | POA: Diagnosis present

## 2021-06-20 DIAGNOSIS — R339 Retention of urine, unspecified: Secondary | ICD-10-CM | POA: Diagnosis present

## 2021-06-20 DIAGNOSIS — R4701 Aphasia: Secondary | ICD-10-CM | POA: Diagnosis present

## 2021-06-20 DIAGNOSIS — Z87891 Personal history of nicotine dependence: Secondary | ICD-10-CM

## 2021-06-20 DIAGNOSIS — C719 Malignant neoplasm of brain, unspecified: Secondary | ICD-10-CM | POA: Diagnosis present

## 2021-06-20 DIAGNOSIS — D496 Neoplasm of unspecified behavior of brain: Principal | ICD-10-CM | POA: Diagnosis present

## 2021-06-20 DIAGNOSIS — H5702 Anisocoria: Secondary | ICD-10-CM | POA: Diagnosis present

## 2021-06-20 DIAGNOSIS — I10 Essential (primary) hypertension: Secondary | ICD-10-CM | POA: Diagnosis present

## 2021-06-20 DIAGNOSIS — G936 Cerebral edema: Secondary | ICD-10-CM | POA: Diagnosis present

## 2021-06-20 DIAGNOSIS — F32A Depression, unspecified: Secondary | ICD-10-CM | POA: Diagnosis present

## 2021-06-20 DIAGNOSIS — R400 Somnolence: Secondary | ICD-10-CM | POA: Diagnosis present

## 2021-06-20 DIAGNOSIS — F419 Anxiety disorder, unspecified: Secondary | ICD-10-CM | POA: Diagnosis present

## 2021-06-20 DIAGNOSIS — N390 Urinary tract infection, site not specified: Secondary | ICD-10-CM

## 2021-06-20 DIAGNOSIS — G935 Compression of brain: Secondary | ICD-10-CM | POA: Diagnosis present

## 2021-06-20 DIAGNOSIS — I509 Heart failure, unspecified: Secondary | ICD-10-CM

## 2021-06-20 DIAGNOSIS — Z809 Family history of malignant neoplasm, unspecified: Secondary | ICD-10-CM

## 2021-06-20 DIAGNOSIS — R197 Diarrhea, unspecified: Secondary | ICD-10-CM | POA: Diagnosis present

## 2021-06-20 HISTORY — DX: Depression, unspecified: F32.A

## 2021-06-20 HISTORY — DX: Anxiety disorder, unspecified: F41.9

## 2021-06-20 LAB — URINALYSIS, ROUTINE W REFLEX MICROSCOPIC
Bilirubin Urine: NEGATIVE
Glucose, UA: NEGATIVE mg/dL
Ketones, ur: NEGATIVE mg/dL
Nitrite: POSITIVE — AB
Protein, ur: NEGATIVE mg/dL
RBC / HPF: >50 RBC/hpf — ABNORMAL HIGH (ref 0–5)
Specific Gravity, Urine: 1.014 (ref 1.005–1.030)
pH: 6 (ref 5.0–8.0)

## 2021-06-20 LAB — BASIC METABOLIC PANEL
Anion gap: 7 (ref 5–15)
BUN: 15 mg/dL (ref 6–20)
CO2: 26 mmol/L (ref 22–32)
Calcium: 8.9 mg/dL (ref 8.9–10.3)
Chloride: 104 mmol/L (ref 98–111)
Creatinine, Ser: 0.72 mg/dL (ref 0.61–1.24)
GFR, Estimated: >60 mL/min (ref >60)
Glucose, Bld: 112 mg/dL — ABNORMAL HIGH (ref 70–99)
Potassium: 4.1 mmol/L (ref 3.5–5.1)
Sodium: 137 mmol/L (ref 135–145)

## 2021-06-20 LAB — CBC
HCT: 42.8 % (ref 39.0–52.0)
Hemoglobin: 14.4 g/dL (ref 13.0–17.0)
MCH: 31.2 pg (ref 26.0–34.0)
MCHC: 33.6 g/dL (ref 30.0–36.0)
MCV: 92.6 fL (ref 80.0–100.0)
Platelets: 298 10*3/uL (ref 150–400)
RBC: 4.62 MIL/uL (ref 4.22–5.81)
RDW: 12.4 % (ref 11.5–15.5)
WBC: 8.3 10*3/uL (ref 4.0–10.5)
nRBC: 0 % (ref 0.0–0.2)

## 2021-06-20 LAB — PROTIME-INR
INR: 1 (ref 0.8–1.2)
Prothrombin Time: 12.8 seconds (ref 11.4–15.2)

## 2021-06-20 LAB — CBG MONITORING, ED: Glucose-Capillary: 106 mg/dL — ABNORMAL HIGH (ref 70–99)

## 2021-06-20 IMAGING — CT CT HEAD W/O CM
4 of 5 series · 15 of 47 positions shown, 17 images · non-contrast
Comparison: None Available.

CLINICAL DATA: Mental status change, unknown cause thank you empty
capped and obvious



[Series 2: head w o · axial · 0.44mm/px · z∈[+91,+201]mm · 5 of 34 slices shown, 7 images]
[im 6/34  brain]
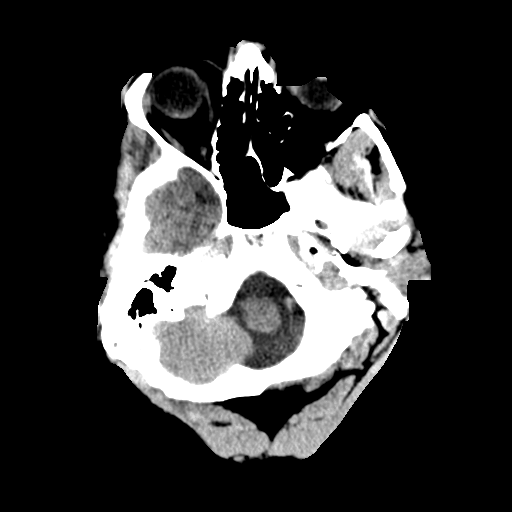
[im 6/34  bone]
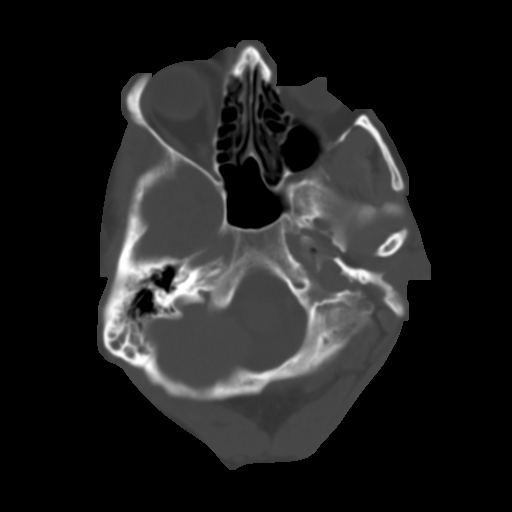
[im 12/34  brain]
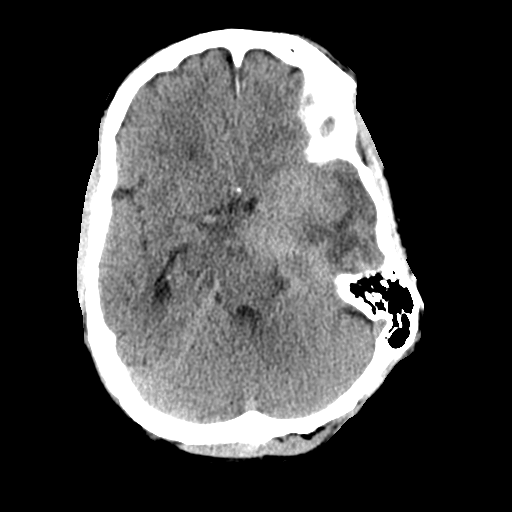
[im 17/34  brain]
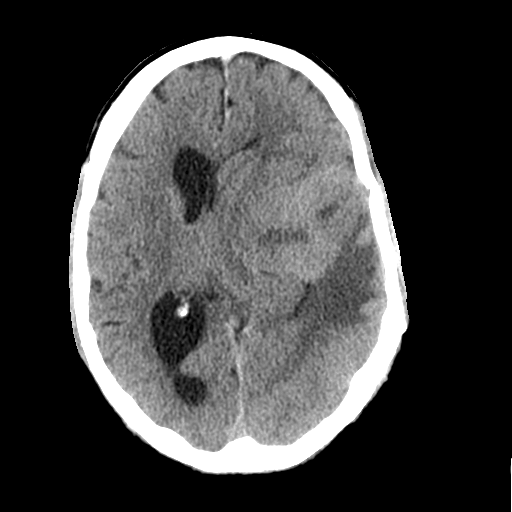
[im 23/34  brain]
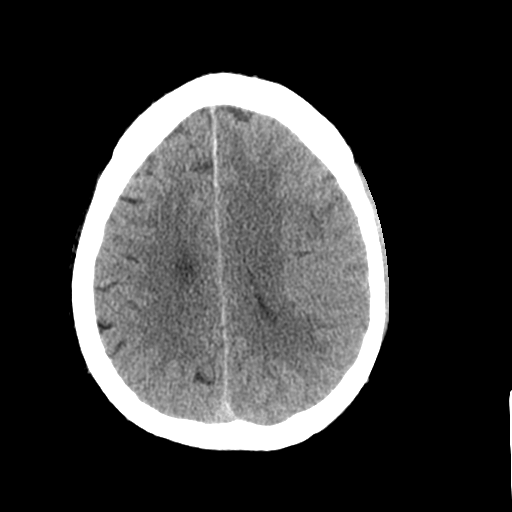
[im 28/34  brain]
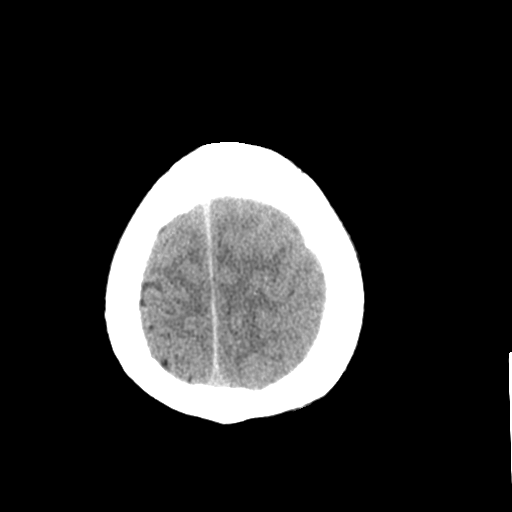
[im 28/34  bone]
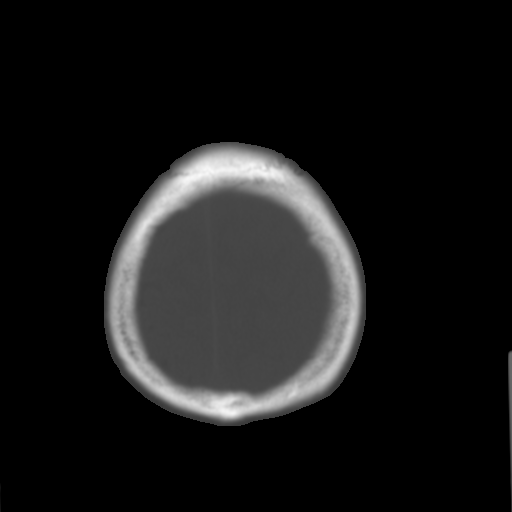

[Series 4: coronal soft · coronal · 0.37mm/px · 3 of 84 slices shown]
[im 28/84  brain]
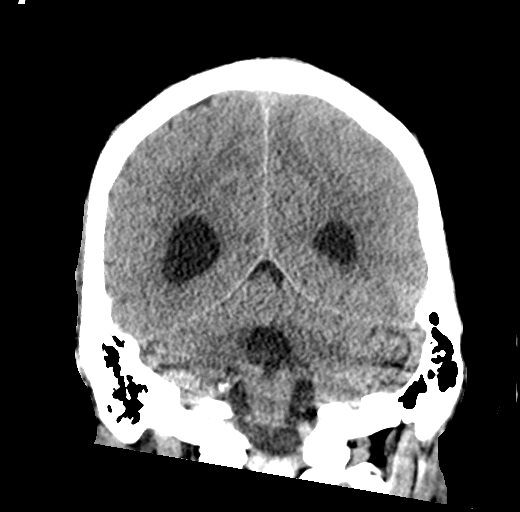
[im 37/84  brain]
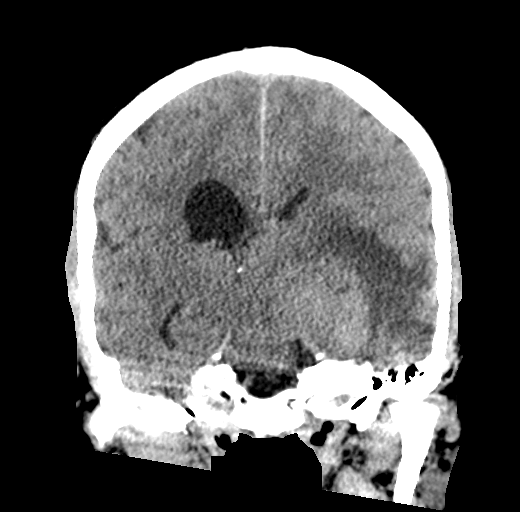
[im 47/84  brain]
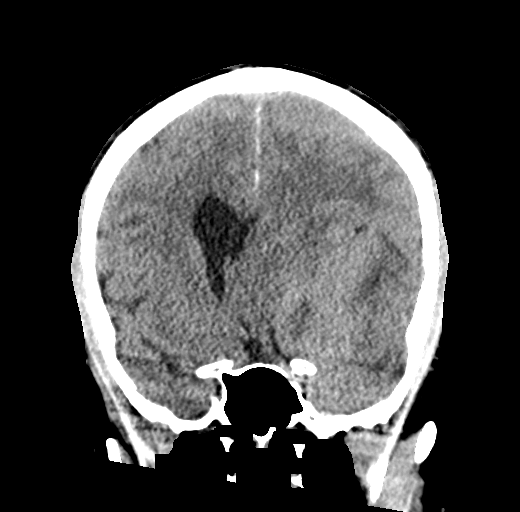

[Series 5: sagittal soft · sagittal · 0.38mm/px · 3 of 62 slices shown]
[im 24/62  brain]
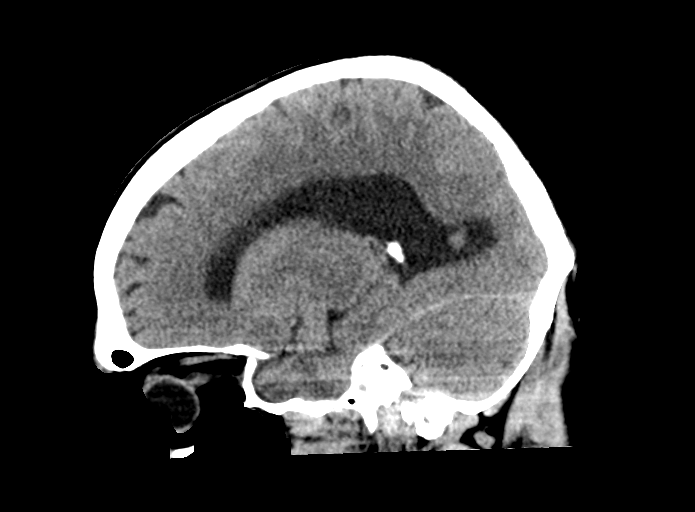
[im 32/62  brain]
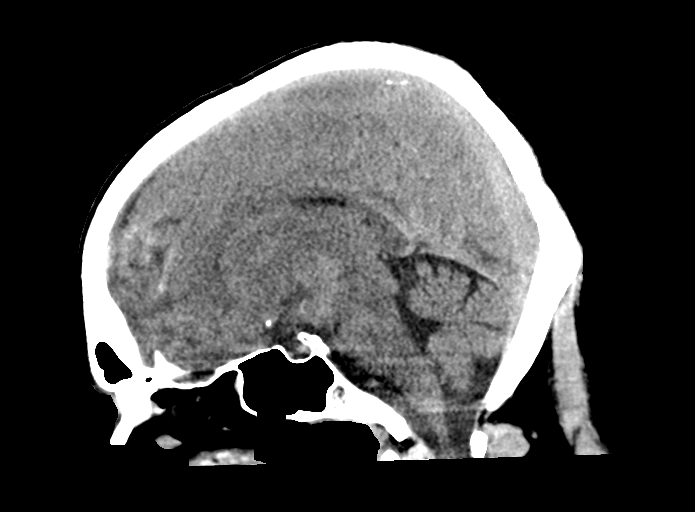
[im 40/62  brain]
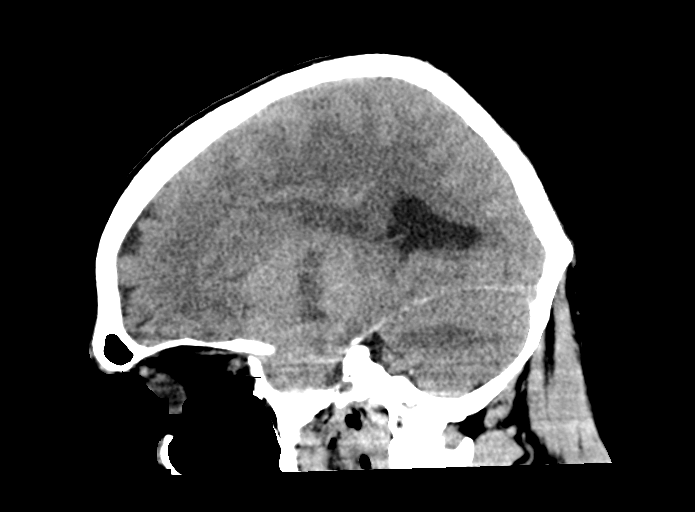

[Series 6: head ax w o · axial · 0.38mm/px · z∈[+87,+172]mm · 4 of 36 slices shown]
[im 6/36  brain]
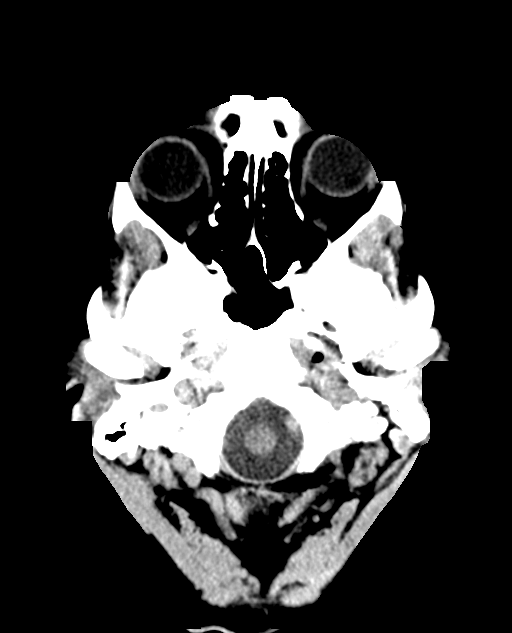
[im 12/36  brain]
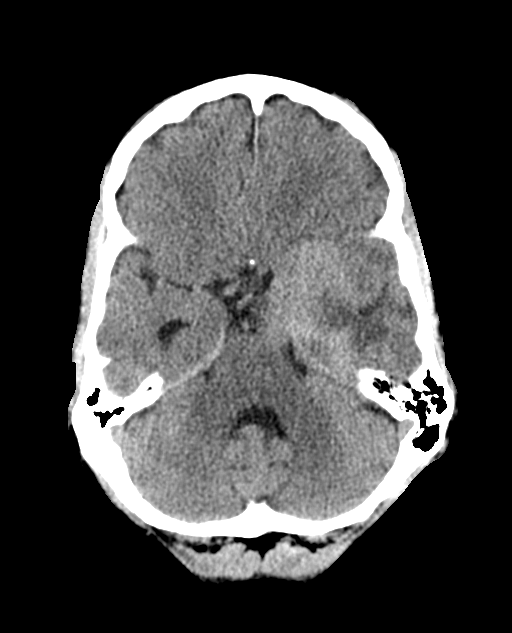
[im 18/36  brain]
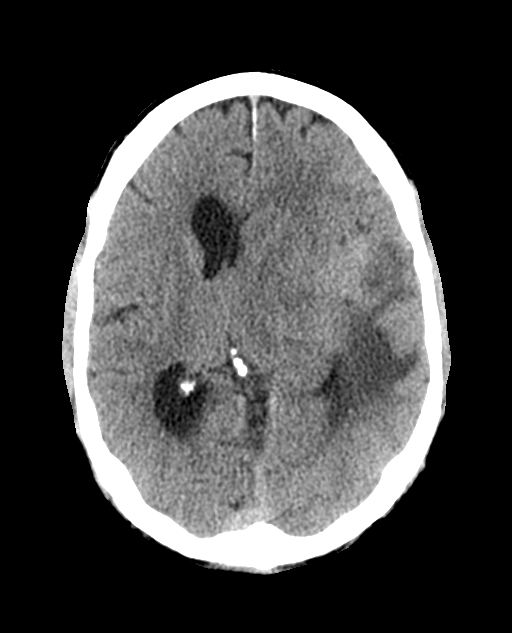
[im 24/36  brain]
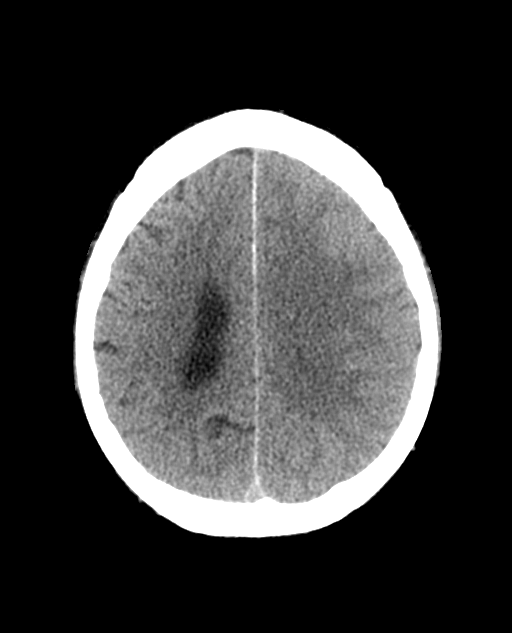

[15 of 47 positions shown; findings below may reference images not displayed]

FINDINGS: Brain: Masslike, somewhat hyperdense area in the left frontal and
temporal lobe, which measures approximately 6.2 x 4.6 x 4.4 cm
(series 2, image 16 and series 4, image 43), although the borders
are difficult to delineate. There areas of hyperdensity within the
mass, most likely hemorrhage. Surrounding hypodensity, likely edema.
Mass effect, effacing the frontal horn, body, and temporal horn of
the left lateral ventricle, as well as the third ventricle, with
suspected resulting enlargement of the right ventricle and left
occipital horn. Mass effect on the midbrain, with likely effacement
of the ambient cisterns. Approximately 12 mm of left-to-right
midline shift. No extra-axial collection.

Vascular: No hyperdense vessel.

Skull: Normal. Negative for fracture or focal lesion.

Sinuses/Orbits: No acute finding.

Other: Trace fluid in the right-greater-than-left mastoid air cells.
IMPRESSION: Masslike area in the left frontal and temporal lobe, favored to
represent a primary neoplasm with areas of internal hemorrhage, with
a large infarct with hemorrhagic transformation felt to be less
likely. This causes significant surrounding edema and mass effect,
with 12 mm of left-to-right midline shift, as well as effacement of
left lateral ventricle and third ventricle, with likely enlargement
of the right lateral ventricle and left occipital horn. An MRI with
and without contrast is recommended.

These results were called by telephone at the time of interpretation
on [DATE] at [DATE] to provider NAZARETH , who verbally
acknowledged these results.

## 2021-06-20 IMAGING — MR MR HEAD WO/W CM
13 of 15 series · 37 of 48 positions shown · IV contrast (7 ml Gadavist)
Comparison: Head CT [DATE]

CLINICAL DATA: Brain/CNS neoplasm, staging. Abnormal head CT.
Altered mental status.

EXAM:
MRI HEAD WITHOUT AND WITH CONTRAST
TECHNIQUE: Multiplanar, multiecho pulse sequences of the brain and surrounding
structures were obtained without and with intravenous contrast.
CONTRAST:  7mL GADAVIST GADOBUTROL 1 MMOL/ML IV SOLN

[Series 5: DWI · axial · 4.0mm · 0.88mm/px · z∈[-39,+100]mm · 3 of 36 slices shown (1 of 6)]
[im 1/36]
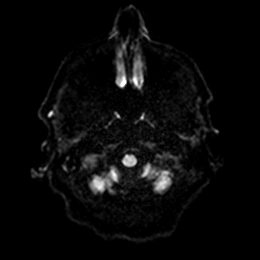
[im 18/36]
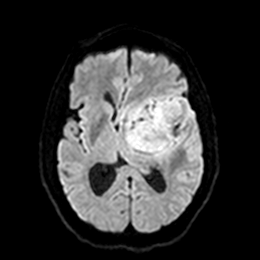
[im 36/36]
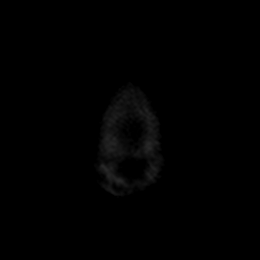

[Series 5: DWI · axial · 4.0mm · 0.88mm/px · z∈[-39,+100]mm · 4 of 36 slices shown (2 of 6)]
[im 1/36]
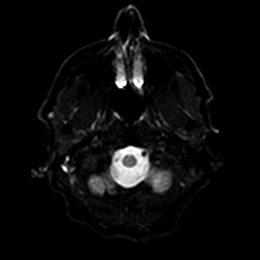
[im 12/36]
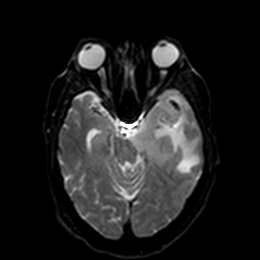
[im 24/36]
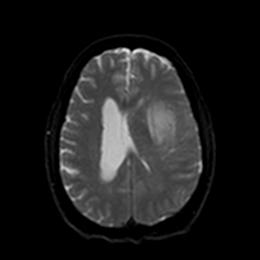
[im 36/36]
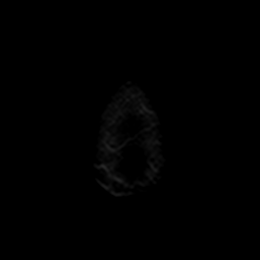

[Series 6: DWI · axial · 4.0mm · 0.88mm/px · z∈[-39,+100]mm · 4 of 36 slices shown (3 of 6)]
[im 1/36]
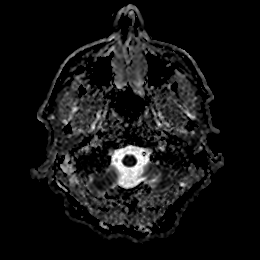
[im 12/36]
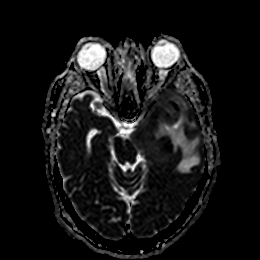
[im 24/36]
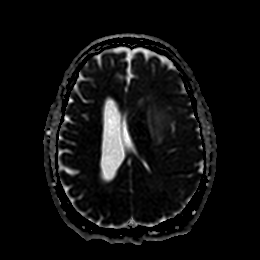
[im 36/36]
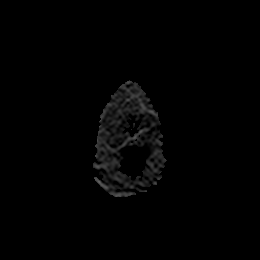

[Series 7: DWI · coronal · 5.0mm · 0.88mm/px · 3 of 30 slices shown (4 of 6)]
[im 1/30]
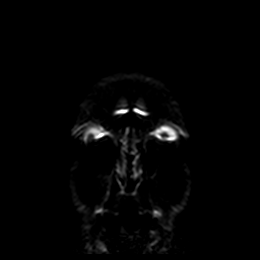
[im 15/30]
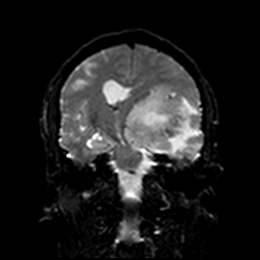
[im 30/30]
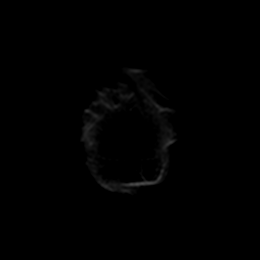

[Series 7: DWI · coronal · 5.0mm · 0.88mm/px · 3 of 30 slices shown (5 of 6)]
[im 1/30]
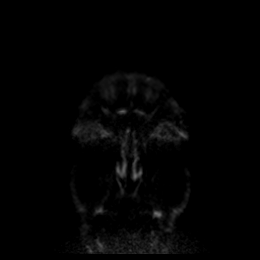
[im 15/30]
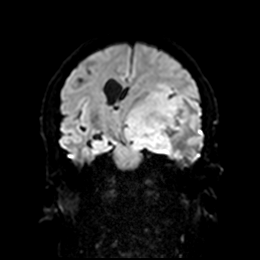
[im 30/30]
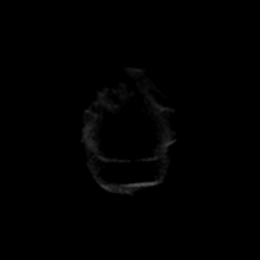

[Series 8: DWI · coronal · 5.0mm · 0.88mm/px · 3 of 30 slices shown (6 of 6)]
[im 1/30]
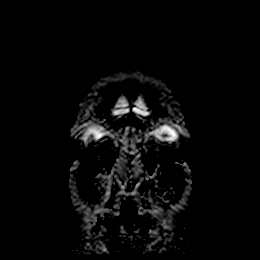
[im 15/30]
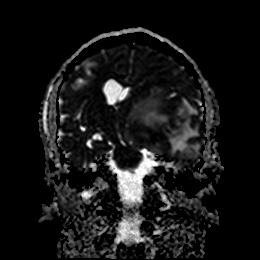
[im 30/30]
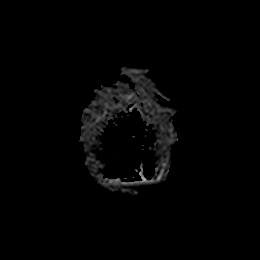

[Series 9: T1 · sagittal · 5.0mm · 0.94mm/px · 2 of 21 slices shown (1 of 2)]
[im 1/21]
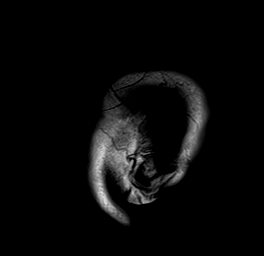
[im 21/21]
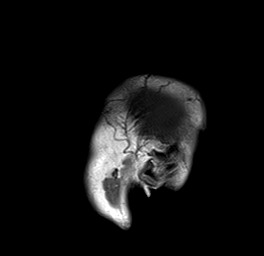

[Series 10: T2 · axial · 5.0mm · 0.72mm/px · z∈[-35,+96]mm · 2 of 20 slices shown (1 of 2)]
[im 1/20]
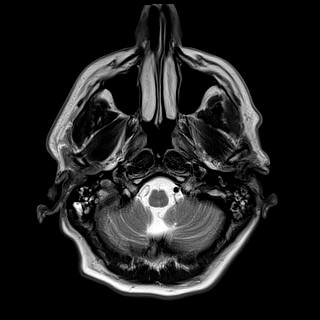
[im 20/20]
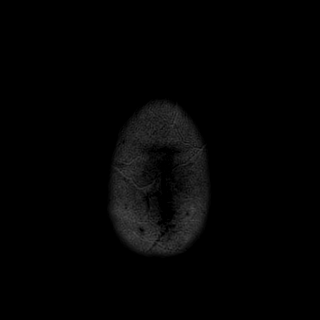

[Series 11: ax hemo · axial · 5.0mm · 0.86mm/px · z∈[-40,+102]mm · 2 of 25 slices shown]
[im 1/25]
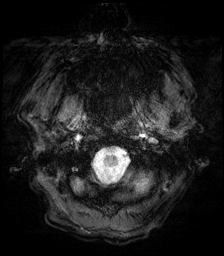
[im 25/25]
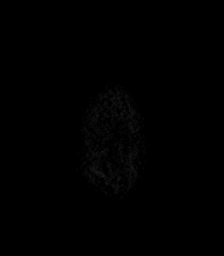

[Series 12: FLAIR · axial · 4.0mm · 0.43mm/px · z∈[-30,+92]mm · 3 of 32 slices shown]
[im 1/32]
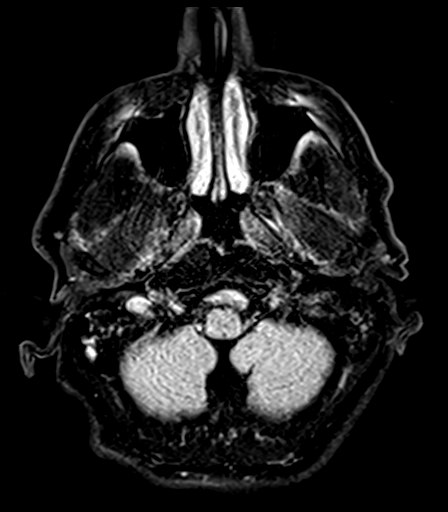
[im 16/32]
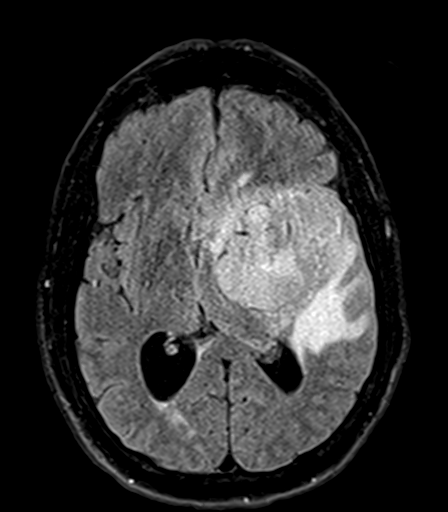
[im 32/32]
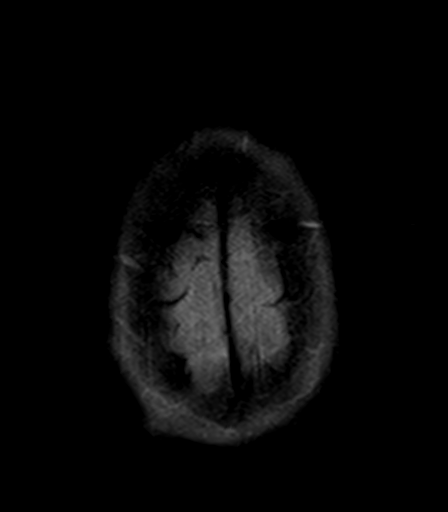

[Series 14: T2 · coronal · 5.0mm · 0.72mm/px · 3 of 28 slices shown (2 of 2)]
[im 1/28]
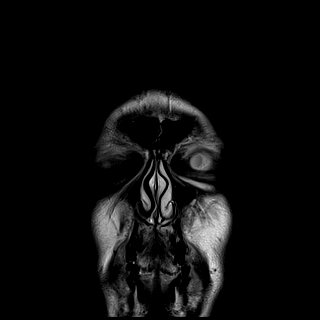
[im 14/28]
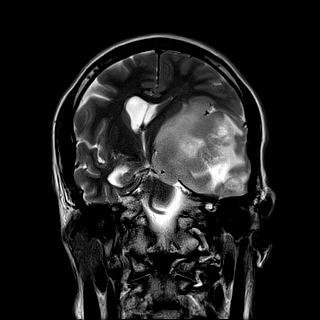
[im 28/28]
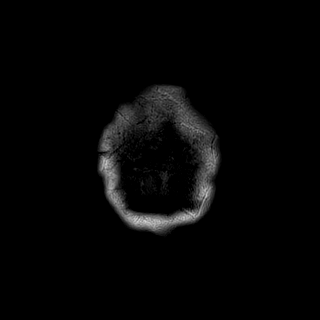

[Series 16: T1 post-contrast · coronal · 5.0mm · 0.34mm/px · 3 of 28 slices shown]
[im 1/28]
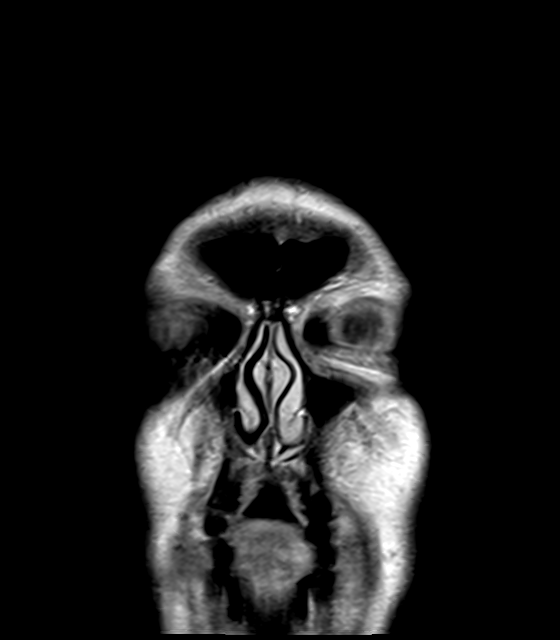
[im 14/28]
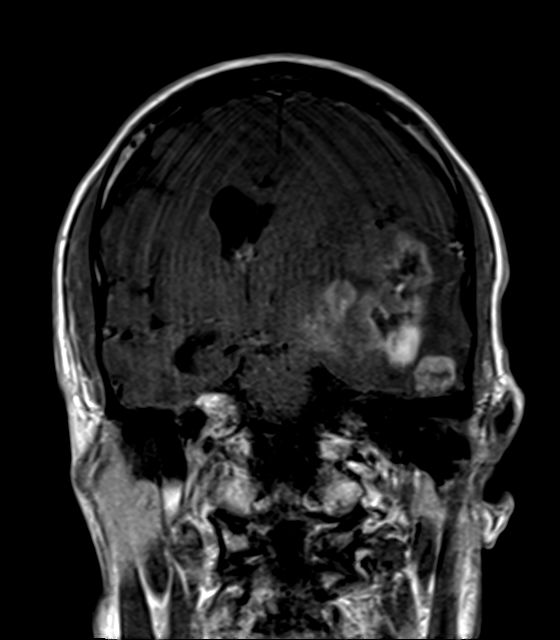
[im 28/28]
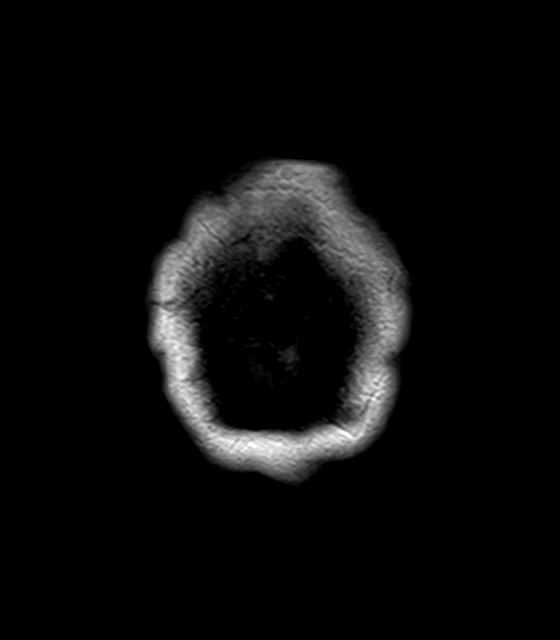

[Series 17: T1 · sagittal · 5.0mm · 0.94mm/px · 2 of 21 slices shown (2 of 2)]
[im 1/21]
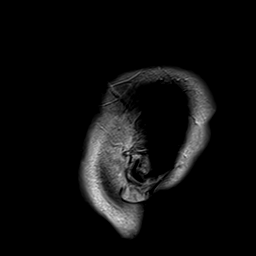
[im 21/21]
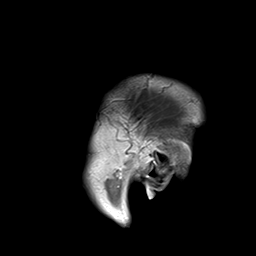

[37 of 48 positions shown; findings below may reference images not displayed]

FINDINGS: Brain: A heterogeneously enhancing mass involving the left temporal
lobe, frontal lobe, and insula measures 6.0 x 5.0 cm with areas of
internal necrosis and blood products. There is involvement of the
cortex which is thickened. An adjacent heterogeneously enhancing
satellite nodule in the left temporal lobe measures 2.0 cm. There is
a moderate amount of nonenhancing T2 hyperintensity in the left
frontotemporal white matter which may reflect edema and possibly
nonenhancing tumor. There is mass effect on the left basal ganglia,
left lateral ventricle, and third ventricle with rightward midline
shift of 1.3 cm. The right lateral ventricle appears mildly dilated.
There is also prominent mass effect on the left midbrain. A few
punctate foci of T2 hyperintensity are noted elsewhere in the
cerebral white matter bilaterally and in the pons, nonspecific but
compatible with chronic small vessel ischemia. No acute infarct or
extra-axial fluid collection is identified.

Vascular: Major intracranial vascular flow voids are preserved.
Displacement of the left MCA by the mass.

Skull and upper cervical spine: Unremarkable bone marrow signal.

Sinuses/Orbits: Unremarkable orbits. Clear paranasal sinuses. Small
bilateral mastoid effusions.

Other: None.
IMPRESSION: 6 cm left frontotemporal mass concerning for high-grade glioma.
Regional mass effect and edema with 1.3 cm of rightward midline
shift and mild dilatation of the right lateral ventricle.

## 2021-06-20 MED ORDER — ACETAMINOPHEN 325 MG PO TABS: 650.0000 mg | ORAL_TABLET | ORAL | Status: DC | PRN

## 2021-06-20 MED ORDER — GADOBUTROL 1 MMOL/ML IV SOLN
7.0000 mL | Freq: Once | INTRAVENOUS | Status: AC | PRN
  Administered 2021-06-20: 7 mL via INTRAVENOUS

## 2021-06-20 MED ORDER — PANTOPRAZOLE SODIUM 40 MG PO TBEC
40.0000 mg | DELAYED_RELEASE_TABLET | Freq: Every day | ORAL | Status: DC
  Filled 2021-06-20: qty 1

## 2021-06-20 MED ORDER — LORAZEPAM 2 MG/ML IJ SOLN: 1.0000 mg | Freq: Once | INTRAMUSCULAR | Status: AC

## 2021-06-20 MED ORDER — DEXAMETHASONE SODIUM PHOSPHATE 4 MG/ML IJ SOLN
4.0000 mg | Freq: Once | INTRAMUSCULAR | Status: AC
  Administered 2021-06-20: 4 mg via INTRAVENOUS
  Filled 2021-06-20: qty 1

## 2021-06-20 MED ORDER — POTASSIUM CHLORIDE IN NACL 20-0.9 MEQ/L-% IV SOLN
INTRAVENOUS | Status: DC
  Filled 2021-06-20 (×4): qty 1000

## 2021-06-20 MED ORDER — HALOPERIDOL LACTATE 5 MG/ML IJ SOLN
5.0000 mg | Freq: Once | INTRAMUSCULAR | Status: AC
  Administered 2021-06-20: 5 mg via INTRAVENOUS
  Filled 2021-06-20: qty 1

## 2021-06-20 MED ORDER — LORAZEPAM 2 MG/ML IJ SOLN
INTRAMUSCULAR | Status: AC
  Administered 2021-06-20: 1 mg via INTRAVENOUS
  Filled 2021-06-20: qty 1

## 2021-06-20 MED ORDER — SODIUM CHLORIDE 0.9 % IV SOLN
2.0000 g | Freq: Once | INTRAVENOUS | Status: AC
  Administered 2021-06-20: 2 g via INTRAVENOUS
  Filled 2021-06-20: qty 20

## 2021-06-20 MED ORDER — SODIUM CHLORIDE 0.9 % IV BOLUS
1000.0000 mL | Freq: Once | INTRAVENOUS | Status: AC
  Administered 2021-06-20: 1000 mL via INTRAVENOUS

## 2021-06-20 MED ORDER — ONDANSETRON HCL 4 MG/2ML IJ SOLN: 4.0000 mg | Freq: Four times a day (QID) | INTRAMUSCULAR | Status: DC | PRN

## 2021-06-20 MED ORDER — POLYETHYLENE GLYCOL 3350 17 G PO PACK: 17.0000 g | PACK | Freq: Every day | ORAL | Status: DC | PRN

## 2021-06-20 MED ORDER — DOCUSATE SODIUM 100 MG PO CAPS: 100.0000 mg | ORAL_CAPSULE | Freq: Two times a day (BID) | ORAL | Status: DC | PRN

## 2021-06-20 MED ORDER — LORAZEPAM 2 MG/ML IJ SOLN
1.0000 mg | Freq: Once | INTRAMUSCULAR | Status: AC
  Administered 2021-06-20: 1 mg via INTRAVENOUS
  Filled 2021-06-20: qty 1

## 2021-06-20 MED ORDER — LORAZEPAM 2 MG/ML IJ SOLN
0.5000 mg | INTRAMUSCULAR | Status: DC | PRN
  Administered 2021-06-20 – 2021-06-21 (×4): 0.5 mg via INTRAVENOUS
  Filled 2021-06-20 (×4): qty 1

## 2021-06-20 MED ORDER — LEVETIRACETAM IN NACL 500 MG/100ML IV SOLN
500.0000 mg | Freq: Two times a day (BID) | INTRAVENOUS | Status: DC
  Administered 2021-06-20 – 2021-06-24 (×8): 500 mg via INTRAVENOUS
  Filled 2021-06-20 (×8): qty 100

## 2021-06-20 MED ORDER — DEXAMETHASONE SODIUM PHOSPHATE 10 MG/ML IJ SOLN
10.0000 mg | Freq: Four times a day (QID) | INTRAMUSCULAR | Status: DC
  Administered 2021-06-20 – 2021-06-22 (×8): 10 mg via INTRAVENOUS
  Filled 2021-06-20 (×8): qty 1

## 2021-06-20 NOTE — ED Notes (Signed)
Patient requesting to leave due to information pertained to MRI. Patient on the edge of the bed and persuaded to return to bed until physician returns. Patient verbalized understanding.  ?

## 2021-06-20 NOTE — Progress Notes (Signed)
I spoke with the patient's wife. We discussed the patient's brain tumor.  ?

## 2021-06-20 NOTE — ED Notes (Signed)
Patient attempting to get out of bed and trying to pull out IV. Patient states he wants to leave and be back in a couple of weeks. Patient able to be redirected back into bed, however patient continuing to state he wants to leave, however patient cannot understand directions being ask by this nurse. Patient attempted to smoke pulse ox probe like a cigarette. Patient is altered and unable to understand directions or form correct responses to questions asked. Wife is at bedside, tearful, attempting to redirect patient without success. Dr. Eulis Foster notified and awaiting orders.  ?

## 2021-06-20 NOTE — ED Notes (Signed)
Patient now resting in bed with eyes closed. Respirations even and unlabored. NAD noted. Wife at bedside  ?

## 2021-06-20 NOTE — ED Provider Notes (Signed)
?[place] ?Provider Note ? ? ?CSN: [id number] ?Arrival date & time: 06/20/21  2542 ? ?  ? ?History ? ?Chief Complaint  ?Patient presents with  ? Weakness  ? ? ?Todd [name] is a 61 y.o. male. ? ?HPI ?Patient presenting for evaluation of confusion, weakness causing falls, and diarrhea for the last 3 days.  He was put on an increased dose of Lexapro, several weeks ago as treatment for depression.  He has not had any improvement in his depression symptoms.  His wife states that he sits around a lot with his head slumped forward on his chest.  Today he was driving, to the store, when his wife change places with him because she felt he was being erratic.  Then they went shopping and while walking he collapsed.  He did not injure himself, when he went to the floor.  He has had several other episodes of collapsing but no loss of consciousness or injuries after these episodes.  The difficulty seem to be on and off, and possibly are exacerbated by standing.  He has not had this previously.  He is not complaining of chest pain or headache. ?  ? ?Home Medications ?Prior to Admission medications   ?Medication Sig Start Date End Date Taking? Authorizing Provider  ?aspirin EC 81 MG tablet Take 81 mg by mouth daily. Swallow whole.   Yes [provider]  ?Chlorpheniramine-DM (COUGH & COLD PO) Take 5 mLs by mouth every 6 (six) hours as needed (cough).   Yes [provider]  ?diphenhydrAMINE HCl, Sleep, (ZZZQUIL PO) Take 30 mLs by mouth daily as needed (takes at bedtime for sleep).   Yes [provider]  ?escitalopram (LEXAPRO) 10 MG tablet Take 1 tablet (10 mg total) by mouth daily. 05/23/21  Yes Lindell Spar, MD  ?losartan (COZAAR) 25 MG tablet Take 1 tablet (25 mg total) by mouth daily. 03/21/21  Yes Lindell Spar, MD  ?metoprolol succinate (TOPROL XL) 50 MG 24 hr tablet Take 1 tablet (50 mg total) by mouth daily. Take with or immediately following a meal. 03/21/21  Yes Lindell Spar, MD  ?   ? ?Allergies    ?Patient has no known allergies.   ? ?Review of Systems   ?Review of Systems ? ?Physical Exam ?Updated Vital Signs ?BP 125/79   Pulse 73   Temp 98.6 ?F (37 ?C) (Oral)   Resp 12   Ht '5\' 10"'$  (1.778 m)   Wt 72.6 kg   SpO2 99%   BMI 22.96 kg/m?  ?Physical Exam ?Vitals and nursing note reviewed.  ?Constitutional:   ?   General: He is not in acute distress. ?   Appearance: He is well-developed. He is not ill-appearing, toxic-appearing or diaphoretic.  ?HENT:  ?   Head: Normocephalic and atraumatic.  ?   Right Ear: External ear normal.  ?   Left Ear: External ear normal.  ?Eyes:  ?   Conjunctiva/sclera: Conjunctivae normal.  ?   Pupils: Pupils are equal, round, and reactive to light.  ?Neck:  ?   Trachea: Phonation normal.  ?Cardiovascular:  ?   Rate and Rhythm: Normal rate and regular rhythm.  ?   Heart sounds: Normal heart sounds.  ?Pulmonary:  ?   Effort: Pulmonary effort is normal.  ?   Breath sounds: Normal breath sounds.  ?Abdominal:  ?   Palpations: Abdomen is soft.  ?   Tenderness: There is no abdominal tenderness.  ?Musculoskeletal:     ?  General: Normal range of motion.  ?   Cervical back: Normal range of motion and neck supple.  ?   Comments: Normal strength arms and legs bilaterally.  ?Skin: ?   General: Skin is warm and dry.  ?Neurological:  ?   Mental Status: He is alert and oriented to person, place, and time.  ?   Cranial Nerves: No cranial nerve deficit.  ?   Sensory: No sensory deficit.  ?   Motor: No abnormal muscle tone.  ?   Coordination: Coordination normal.  ?   Comments: No dysarthria or aphasia.  No ataxia or pronator drift.  ?Psychiatric:     ?   Mood and Affect: Mood normal.     ?   Behavior: Behavior normal.  ? ? ?ED Results / Procedures / Treatments   ?Labs ?(all labs ordered are listed, but only abnormal results are displayed) ?Labs Reviewed  ?BASIC METABOLIC PANEL - Abnormal; Notable for the following components:  ?    Result Value  ? Glucose, Bld  112 (*)   ? All other components within normal limits  ?URINALYSIS, ROUTINE W REFLEX MICROSCOPIC - Abnormal; Notable for the following components:  ? APPearance HAZY (*)   ? Hgb urine dipstick MODERATE (*)   ? Nitrite POSITIVE (*)   ? Leukocytes,Ua LARGE (*)   ? RBC / HPF >50 (*)   ? Bacteria, UA MANY (*)   ? All other components within normal limits  ?CBG MONITORING, ED - Abnormal; Notable for the following components:  ? Glucose-Capillary 106 (*)   ? All other components within normal limits  ?CBC  ? ? ?EKG ?EKG Interpretation ? ?Date/Time:  Monday Jun 20 2021 09:53:05 EDT ?Ventricular Rate:  76 ?PR Interval:  184 ?QRS Duration: 105 ?QT Interval:  391 ?QTC Calculation: 440 ?R Axis:   34 ?Text Interpretation: Sinus rhythm Atrial premature complex Minimal ST elevation, anterior leads No old tracing to compare Confirmed by Daleen Bo [id number]) on 06/20/2021 10:13:28 AM ? ?Radiology ?No results found. ? ?Procedures ?Procedures  ? ? ?Medications Ordered in ED ?Medications  ?sodium chloride 0.9 % bolus 1,000 mL (has no administration in time range)  ? ? 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  I informed them that this is a serious problem that he may need to be admitted.  I have placed a call to neurosurgery to discuss appropriate treatment with them. [EW]  ?Cook I talked to neurosurgery Dr. Arnoldo Morale who recommends that the patient be transferred to his service at [facility] for further evaluation and treatment of the primary tumor which appears to be a glioma with shift, secondary to edema, causing mental status changes.  Patient is at high risk for discharge.  Patient states he does not want to be admitted or transferred.  Wife is with him.  She is upset and crying.  Patient is adamant about leaving.   He states he "wants to think about it." [EW]  ?  ?Clinical Course User Index ?[EW] Daleen Bo, MD  ? ?                        ?Medical Decision Making ?Patient presenting with confusion, weakness and falling.  Today he was driving and  his wife had to take over because he was unsafe.  She reports that he is inattentive.  She has never seen him like this before. ? ?Problems Addressed: ?Benign neoplasm of brain, unspecified brain region [facility]): undiagnosed new problem with uncertain prognosis ?Urinary tract infection without hematuria, site unspecified: undiagnosed new problem with uncertain prognosis ? ?Amount and/or Complexity of Data Reviewed ?Independent Historian: caregiver ?   Details: Wife at the bedside gives most of the history. ?External Data Reviewed: notes. ?   Details: Evaluated by PCP, 05/23/2021, management for hypertension and depression with treatment of Lexapro medication.  He was noted to have a period of dizziness, that was transient at that time.  No interventions were undertaken for that. ?Labs: ordered. ?   Details: CBC, metabolic panel, urinalysis-normal except evidence for UTI. ?Radiology: ordered. Decision-making details documented in ED Course. ?   Details: CT head, likely left brain tumor with associated edema and left-to-right shift.  MRI ordered to confirm and shows likely glioblastoma.  No evidence for CVA. ?Discussion of management or test interpretation with external provider(s): Case discussed with neurosurgery Dr. Arnoldo Morale at [facility] who agrees with admission to his service for management and treatment. ? ?Risk ?Prescription drug management. ?Decision regarding hospitalization. ?Risk Details: Patient with nonspecific symptoms including weakness, confusion and falling.  Urinalysis consistent with infection, treatment initiated with Rocephin.  CT head shows likely new, undiagnosed brain tumor.  Significant left-to-right shift from edema likely contributing to confusion.  Patient  acutely ill.  He was treated with Decadron for brain swelling, and neurosurgery was consulted.  Dr. Arnoldo Morale will admit the patient.  Patient requires transfer to [facility].  He is stable for transport.  He do

## 2021-06-20 NOTE — Progress Notes (Signed)
[facility] [facility] made aware of patient's arrival to the unit.  Awaiting orders. ?

## 2021-06-20 NOTE — ED Notes (Addendum)
Patient have dysarthria and difficulty following some direction with [facility]. Patient has hx of stutter but is having difficult conversation forming descriptive words and making sense of conversation at this time and naming objects. Patient also trying to close eyes during conversation. ?

## 2021-06-20 NOTE — Consult Note (Signed)
Reason for Consult: Brain tumor Referring Physician: Dr. [name] is an 61 y.o. male.  HPI: The patient is a 61 year old white male with a history of congestive heart failure who presented with a 5-day history of confusion, weakness, falls, etc.  He was evaluated at the [facility], ER to include a head CT and brain MRI which demonstrated a large left temporal intra-axial brain tumor with significant brainstem and midline shift.  The patient was transferred to [facility] for further neurosurgical care and management.  Presently the patient is alone and confused and cannot provide me a history.  Past Medical History:  Diagnosis Date   Anxiety    CHF (congestive heart failure) (HCC)    Depression     History reviewed. No pertinent surgical history.  Family History  Problem Relation Age of Onset   Cancer Mother    Dementia Father     Social History:  reports that he has quit smoking. He has never used smokeless tobacco. He reports that he does not drink alcohol and does not use drugs.  Allergies: No Known Allergies  Medications: I have reviewed the patient's current medications. Prior to Admission:  Medications Prior to Admission  Medication Sig Dispense Refill Last Dose   aspirin EC 81 MG tablet Take 81 mg by mouth daily. Swallow whole.   06/20/2021   Chlorpheniramine-DM (COUGH & COLD PO) Take 5 mLs by mouth every 6 (six) hours as needed (cough).   06/19/2021   diphenhydrAMINE HCl, Sleep, (ZZZQUIL PO) Take 30 mLs by mouth daily as needed (takes at bedtime for sleep).   06/19/2021   escitalopram (LEXAPRO) 10 MG tablet Take 1 tablet (10 mg total) by mouth daily. 30 tablet 3 06/19/2021   losartan (COZAAR) 25 MG tablet Take 1 tablet (25 mg total) by mouth daily. 90 tablet 1 06/19/2021   metoprolol succinate (TOPROL XL) 50 MG 24 hr tablet Take 1 tablet (50 mg total) by mouth daily. Take with or immediately following a meal. 90 tablet 1 06/20/2021 at 1000    Scheduled: Continuous: PRN: Anti-infectives (From admission, onward)    Start     Dose/Rate Route Frequency Ordered Stop   06/20/21 1100  cefTRIAXone (ROCEPHIN) 2 g in sodium chloride 0.9 % 100 mL IVPB        2 g 200 mL/hr over 30 Minutes Intravenous  Once 06/20/21 1055 06/20/21 1356        Results for orders placed or performed during the hospital encounter of 06/20/21 (from the past 48 hour(s))  Urinalysis, Routine w reflex microscopic Urine, In & Out Cath     Status: Abnormal   Collection Time: 06/20/21 10:02 AM  Result Value Ref Range   Color, Urine YELLOW YELLOW   APPearance HAZY (A) CLEAR   Specific Gravity, Urine 1.014 1.005 - 1.030   pH 6.0 5.0 - 8.0   Glucose, UA NEGATIVE NEGATIVE mg/dL   Hgb urine dipstick MODERATE (A) NEGATIVE   Bilirubin Urine NEGATIVE NEGATIVE   Ketones, ur NEGATIVE NEGATIVE mg/dL   Protein, ur NEGATIVE NEGATIVE mg/dL   Nitrite POSITIVE (A) NEGATIVE   Leukocytes,Ua LARGE (A) NEGATIVE   RBC / HPF >50 (H) 0 - 5 RBC/hpf   WBC, UA 21-50 0 - 5 WBC/hpf   Bacteria, UA MANY (A) NONE SEEN   Squamous Epithelial / LPF 0-5 0 - 5   Mucus PRESENT    Hyaline Casts, UA PRESENT     Comment: Performed at [facility],  [address], [place], [place] [address]  CBG monitoring, ED     Status: Abnormal   Collection Time: 06/20/21 10:06 AM  Result Value Ref Range   Glucose-Capillary 106 (H) 70 - 99 mg/dL    Comment: Glucose reference range applies only to samples taken after fasting for at least 8 hours.  Basic metabolic panel     Status: Abnormal   Collection Time: 06/20/21 10:21 AM  Result Value Ref Range   Sodium 137 135 - 145 mmol/L   Potassium 4.1 3.5 - 5.1 mmol/L   Chloride 104 98 - 111 mmol/L   CO2 26 22 - 32 mmol/L   Glucose, Bld 112 (H) 70 - 99 mg/dL    Comment: Glucose reference range applies only to samples taken after fasting for at least 8 hours.   BUN 15 6 - 20 mg/dL   Creatinine, Ser 1.32 0.61 - 1.24 mg/dL   Calcium 8.9 8.9 - 44.0 mg/dL    GFR, Estimated >10 >27 mL/min    Comment: (NOTE) Calculated using the CKD-EPI Creatinine Equation (2021)    Anion gap 7 5 - 15    Comment: Performed at [facility], [address] [place], [place] [address]  CBC     Status: None   Collection Time: 06/20/21 10:21 AM  Result Value Ref Range   WBC 8.3 4.0 - 10.5 K/uL   RBC 4.62 4.22 - 5.81 MIL/uL   Hemoglobin 14.4 13.0 - 17.0 g/dL   HCT 44.0 34.7 - 42.5 %   MCV 92.6 80.0 - 100.0 fL   MCH 31.2 26.0 - 34.0 pg   MCHC 33.6 30.0 - 36.0 g/dL   RDW 95.6 38.7 - 56.4 %   Platelets 298 150 - 400 K/uL   nRBC 0.0 0.0 - 0.2 %    Comment: Performed at [facility], [address] [place], [place] [address]    CT Head Wo Contrast  Result Date: 06/20/2021 CLINICAL DATA:  Mental status change, unknown cause thank you empty capped and obvious EXAM: CT HEAD WITHOUT CONTRAST TECHNIQUE: Contiguous axial images were obtained from the base of the skull through the vertex without intravenous contrast. RADIATION DOSE REDUCTION: This exam was performed according to the departmental dose-optimization program which includes automated exposure control, adjustment of the mA and/or kV according to patient size and/or use of iterative reconstruction technique. COMPARISON:  None Available. FINDINGS: Brain: Masslike, somewhat hyperdense area in the left frontal and temporal lobe, which measures approximately 6.2 x 4.6 x 4.4 cm (series 2, image 16 and series 4, image 43), although the borders are difficult to delineate. There areas of hyperdensity within the mass, most likely hemorrhage. Surrounding hypodensity, likely edema. Mass effect, effacing the frontal horn, body, and temporal horn of the left lateral ventricle, as well as the third ventricle, with suspected resulting enlargement of the right ventricle and left occipital horn. Mass effect on the midbrain, with likely effacement of the ambient cisterns. Approximately 12 mm of left-to-right midline shift. No extra-axial  collection. Vascular: No hyperdense vessel. Skull: Normal. Negative for fracture or focal lesion. Sinuses/Orbits: No acute finding. Other: Trace fluid in the right-greater-than-left mastoid air cells. IMPRESSION: Masslike area in the left frontal and temporal lobe, favored to represent a primary neoplasm with areas of internal hemorrhage, with a large infarct with hemorrhagic transformation felt to be less likely. This causes significant surrounding edema and mass effect, with 12 mm of left-to-right midline shift, as well as effacement of left lateral ventricle and third  ventricle, with likely enlargement of the right lateral ventricle and left occipital horn. An MRI with and without contrast is recommended. These results were called by telephone at the time of interpretation on 06/20/2021 at 11:18 am to provider [facility] , who verbally acknowledged these results. Electronically Signed   By: Wiliam Ke M.D.   On: 06/20/2021 11:19   MR Brain W and Wo Contrast  Result Date: 06/20/2021 CLINICAL DATA:  Brain/CNS neoplasm, staging. Abnormal head CT. Altered mental status. EXAM: MRI HEAD WITHOUT AND WITH CONTRAST TECHNIQUE: Multiplanar, multiecho pulse sequences of the brain and surrounding structures were obtained without and with intravenous contrast. CONTRAST:  7mL GADAVIST GADOBUTROL 1 MMOL/ML IV SOLN COMPARISON:  Head CT 06/20/2021 FINDINGS: Brain: A heterogeneously enhancing mass involving the left temporal lobe, frontal lobe, and insula measures 6.0 x 5.0 cm with areas of internal necrosis and blood products. There is involvement of the cortex which is thickened. An adjacent heterogeneously enhancing satellite nodule in the left temporal lobe measures 2.0 cm. There is a moderate amount of nonenhancing T2 hyperintensity in the left frontotemporal white matter which may reflect edema and possibly nonenhancing tumor. There is mass effect on the left basal ganglia, left lateral ventricle, and third ventricle  with rightward midline shift of 1.3 cm. The right lateral ventricle appears mildly dilated. There is also prominent mass effect on the left midbrain. A few punctate foci of T2 hyperintensity are noted elsewhere in the cerebral white matter bilaterally and in the pons, nonspecific but compatible with chronic small vessel ischemia. No acute infarct or extra-axial fluid collection is identified. Vascular: Major intracranial vascular flow voids are preserved. Displacement of the left MCA by the mass. Skull and upper cervical spine: Unremarkable bone marrow signal. Sinuses/Orbits: Unremarkable orbits. Clear paranasal sinuses. Small bilateral mastoid effusions. Other: None. 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 Regional mass effect and edema with 1.3 cm of rightward midline shift and mild dilatation of the right lateral ventricle. Electronically Signed   By: Sebastian Ache M.D.   On: 06/20/2021 12:49    ROS: Unobtainable, he does admit to a headache Blood pressure 127/87, pulse 79, temperature 98.1 F (36.7 C), temperature source Axillary, resp. rate 19, height 5\' 10"  (1.778 m), weight 72.6 kg, SpO2 98 %. Estimated body mass index is 22.96 kg/m as calculated from the following:   Height as of this encounter: 5\' 10"  (1.778 m).   Weight as of this encounter: 72.6 kg.  Physical Exam  General: A 61 year old confused and somewhat agitated white male.  HEENT: Normocephalic, atraumatic, pupils equal round reactive light.  He does not cooperate with an extraocular muscle exam.  He has conjugate gaze.  Neck: Unremarkable  Thorax: Symmetric  Abdomen: Soft  Extremities: Unremarkable  Neurologic exam: The patient is mildly somnolent but easily arousable.  He will answer simple questions but cannot provide me with a history and is not entirely cooperative with the exam.  He is moving all 4 extremities but appears right hemiparetic.  He will not cooperate with a sensory or  cerebellar exam.  Imaging studies: I reviewed the patient's head CT and brain MRI performed today.  He has a large intra-axial left temporal lesion with significant mass effect, midline shift, brainstem compression and uncal herniation.  This appears to be a glioblastoma. Assessment/Plan: Left brain tumor: I am awaiting his wife's arrival to discuss the situation with her.  The treatment options include doing nothing, palliative care, biopsy and surgery.  The meantime we will start him on Decadron, Keppra.  We will use restraints to keep him in bed and Ativan if necessary.  Cristi Loron 06/20/2021, 7:09 PM

## 2021-06-20 NOTE — ED Triage Notes (Addendum)
Patient with complaints of weakness, headache, and confusion that started 5 days prior. He has had multiple falls over the past 2 days.  ?

## 2021-06-21 ENCOUNTER — Inpatient Hospital Stay (HOSPITAL_COMMUNITY): Payer: Self-pay | Admitting: Certified Registered Nurse Anesthetist

## 2021-06-21 ENCOUNTER — Inpatient Hospital Stay (HOSPITAL_COMMUNITY): Payer: Self-pay

## 2021-06-21 NOTE — Progress Notes (Signed)
?  Transition of Care (TOC) Screening Note ? ? ?Patient Details  ?Name: Todd [name] ?Date of Birth: 05/05/1960 ? ? ?Transition of Care (TOC) CM/SW Contact:    ?Pollie Friar, RN ?Phone Number: ?06/21/2021, 2:50 PM ? ? ? ?Transition of Care Department [facility]) has reviewed patient. We will continue to monitor patient advancement through interdisciplinary progression rounds. If new patient transition needs arise, please place a TOC consult. ?  ?

## 2021-06-21 NOTE — Progress Notes (Signed)
I spoke with the patient's wife.  She tells me she has had a chance to research brain tumors on the Internet and speak with her family.  We have again discussed the risk, benefits, alternatives, expected postoperative course, and likelihood of achieving our goals with surgery.  She understands this is likely a glioblastoma which is not curable via surgery.  I have answered all her questions.  She has consent for surgery on behalf of the patient.  We will plan to proceed tomorrow afternoon.  He will need to get a BrainLAB protocol MRI in the meantime. ?

## 2021-06-21 NOTE — Progress Notes (Signed)
I attempted this patients scan. He is unable to hold still or follow directions of any kind, the ativan did not seem to help at all. Unfortunately the scan ordered is extremely motion sensitive and pointless for surgical planning unless the patient can remain still. We will either have to try again another time with a higher dose of medication or perform under general anesthesia.  ?

## 2021-06-21 NOTE — Progress Notes (Signed)
Subjective: ?The patient is alert and pleasant.  He remains a bit confused.  He will answer simple questions. ? ?Objective: ?Vital signs in last 24 hours: ?Temp:  [97.6 ?F (36.4 ?C)-98.6 ?F (37 ?C)] 98.4 ?F (36.9 ?C) (05/09 0400) ?Pulse Rate:  [65-84] 79 (05/08 1809) ?Resp:  [12-21] 20 (05/09 0400) ?BP: (104-155)/(66-90) 147/81 (05/09 0400) ?SpO2:  [91 %-100 %] 96 % (05/09 0400) ?Weight:  [71.7 kg-72.6 kg] 71.7 kg (05/09 0421) ?Estimated body mass index is 22.68 kg/m? as calculated from the following: ?  Height as of this encounter: '5\' 10"'$  (1.778 m). ?  Weight as of this encounter: 71.7 kg. ? ? ?Intake/Output from previous day: ?05/08 0701 - 05/09 0700 ?In: 1099 [IV [id number]] ?Out: 50 [Urine:50] ?Intake/Output this shift: ?No intake/output data recorded. ? ?Physical exam the patient is confused.  He answers simple questions.  He is moving all 4 extremities well. ? ?Lab Results: ?Recent Labs  ?  06/20/21 ?1021  ?WBC 8.3  ?HGB 14.4  ?HCT 42.8  ?PLT 298  ? ?BMET ?Recent Labs  ?  06/20/21 ?1021  ?NA 137  ?K 4.1  ?CL 104  ?CO2 26  ?GLUCOSE 112*  ?BUN 15  ?CREATININE 0.72  ?CALCIUM 8.9  ? ? ?Studies/Results: ?CT Head Wo Contrast ? ?Result Date: 06/20/2021 ?CLINICAL DATA:  Mental status change, unknown cause thank you empty capped and obvious EXAM: CT HEAD WITHOUT CONTRAST TECHNIQUE: Contiguous axial images were obtained from the base of the skull through the vertex without intravenous contrast. RADIATION DOSE REDUCTION: This exam was performed according to the departmental dose-optimization program which includes automated exposure control, adjustment of the mA and/or kV according to patient size and/or use of iterative reconstruction technique. COMPARISON:  None Available. FINDINGS: Brain: Masslike, somewhat hyperdense area in the left frontal and temporal lobe, which measures approximately 6.2 x 4.6 x 4.4 cm (series 2, image 16 and series 4, image 43), although the borders are difficult to delineate. There areas of  hyperdensity within the mass, most likely hemorrhage. Surrounding hypodensity, likely edema. Mass effect, effacing the frontal horn, body, and temporal horn of the left lateral ventricle, as well as the third ventricle, with suspected resulting enlargement of the right ventricle and left occipital horn. Mass effect on the midbrain, with likely effacement of the ambient cisterns. Approximately 12 mm of left-to-right midline shift. No extra-axial collection. Vascular: No hyperdense vessel. Skull: Normal. Negative for fracture or focal lesion. Sinuses/Orbits: No acute finding. Other: Trace fluid in the right-greater-than-left mastoid air cells. IMPRESSION: Masslike area in the left frontal and temporal lobe, favored to represent a primary neoplasm with areas of internal hemorrhage, with a large infarct with hemorrhagic transformation felt to be less likely. This causes significant surrounding edema and mass effect, with 12 mm of left-to-right midline shift, as well as effacement of left lateral ventricle and third ventricle, with likely enlargement of the right lateral ventricle and left occipital horn. An MRI with and without contrast is recommended. These results were called by telephone at the time of interpretation on 06/20/2021 at 11:18 am to provider [facility] , who verbally acknowledged these results. Electronically Signed   By: Merilyn Baba M.D.   On: 06/20/2021 11:19  ? ?MR Brain W and Wo Contrast ? ?Result Date: 06/20/2021 ?CLINICAL DATA:  Brain/CNS neoplasm, staging. Abnormal head CT. Altered mental status. EXAM: MRI HEAD WITHOUT AND WITH CONTRAST TECHNIQUE: Multiplanar, multiecho pulse sequences of the brain and surrounding structures were obtained without and with intravenous contrast. CONTRAST:  29m GADAVIST GADOBUTROL 1 MMOL/ML IV SOLN COMPARISON:  Head CT 06/20/2021 FINDINGS: Brain: A heterogeneously enhancing mass involving the left temporal lobe, frontal lobe, and insula measures 6.0 x 5.0 cm with  areas of internal necrosis and blood products. There is involvement of the cortex which is thickened. An adjacent heterogeneously enhancing satellite nodule in the left temporal lobe measures 2.0 cm. There is a moderate amount of nonenhancing T2 hyperintensity in the left frontotemporal white matter which may reflect edema and possibly nonenhancing tumor. There is mass effect on the left basal ganglia, left lateral ventricle, and third ventricle with rightward midline shift of 1.3 cm. The right lateral ventricle appears mildly dilated. There is also prominent mass effect on the left midbrain. A few punctate foci of T2 hyperintensity are noted elsewhere in the cerebral white matter bilaterally and in the pons, nonspecific but compatible with chronic small vessel ischemia. No acute infarct or extra-axial fluid collection is identified. Vascular: Major intracranial vascular flow voids are preserved. Displacement of the left MCA by the mass. Skull and upper cervical spine: Unremarkable bone marrow signal. Sinuses/Orbits: Unremarkable orbits. Clear paranasal sinuses. Small bilateral mastoid effusions. Other: None. 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 Regional mass effect and edema with 1.3 cm of rightward midline shift and mild dilatation of the right lateral ventricle. Electronically Signed   By: ALogan BoresM.D.   On: 06/20/2021 12:49   ? ?Assessment/Plan: ?Left brain tumor: I discussed the situation with the patient's wife last evening via the telephone.  We discussed feelings that this is almost certainly a glioblastoma, i.e. a malignant brain tumor.  We discussed the various treatment options including doing nothing/comfort care, empiric radiation chemotherapy, stereotactic brain biopsy, and craniotomy for resection/debulking of the tumor.  I described that surgery to her.  We discussed the risk of surgery include the risks of anesthesia, hemorrhage, infection, seizures,  incomplete tumor resection, brain swelling which could cause death, medical risk, etc.  I clearly explained that if this is a glioblastoma the surgery would not cure him but hopefully extend his life.  I answered all her questions.  She wanted to talk it over with her daughters.  I will wait to hear her final decision. ? LOS: 1 day  ? ? ? ?Todd [name]?06/21/2021, 7:33 AM ? ? ? ? ?Patient ID: SJaben Benegas male   DOB: 11/13/1960-10-19 61y.o.   MRN: [id number]? ?

## 2021-06-22 ENCOUNTER — Encounter (HOSPITAL_COMMUNITY): Payer: Self-pay | Admitting: Neurosurgery

## 2021-06-22 ENCOUNTER — Inpatient Hospital Stay (HOSPITAL_COMMUNITY): Payer: Self-pay | Admitting: Anesthesiology

## 2021-06-22 ENCOUNTER — Other Ambulatory Visit: Payer: Self-pay

## 2021-06-22 ENCOUNTER — Encounter (HOSPITAL_COMMUNITY)
Admission: EM | Disposition: A | Discharge status: Home / Self Care | Payer: Self-pay | Source: Home / Self Care | Attending: Neurosurgery

## 2021-06-22 ENCOUNTER — Inpatient Hospital Stay (HOSPITAL_COMMUNITY): Payer: Self-pay

## 2021-06-22 DIAGNOSIS — I509 Heart failure, unspecified: Secondary | ICD-10-CM

## 2021-06-22 DIAGNOSIS — D496 Neoplasm of unspecified behavior of brain: Secondary | ICD-10-CM

## 2021-06-22 DIAGNOSIS — F418 Other specified anxiety disorders: Secondary | ICD-10-CM

## 2021-06-22 DIAGNOSIS — I110 Hypertensive heart disease with heart failure: Secondary | ICD-10-CM

## 2021-06-22 HISTORY — PX: CRANIOTOMY: SHX93

## 2021-06-22 HISTORY — PX: APPLICATION OF CRANIAL NAVIGATION: SHX6578

## 2021-06-22 HISTORY — PX: RADIOLOGY WITH ANESTHESIA: SHX6223

## 2021-06-22 LAB — POCT I-STAT 7, (LYTES, BLD GAS, ICA,H+H)
Acid-base deficit: 1 mmol/L (ref 0.0–2.0)
Bicarbonate: 23.8 mmol/L (ref 20.0–28.0)
Calcium, Ion: 1.2 mmol/L (ref 1.15–1.40)
HCT: 39 % (ref 39.0–52.0)
Hemoglobin: 13.3 g/dL (ref 13.0–17.0)
O2 Saturation: 100 %
Potassium: 4.1 mmol/L (ref 3.5–5.1)
Sodium: 140 mmol/L (ref 135–145)
TCO2: 25 mmol/L (ref 22–32)
pCO2 arterial: 40.5 mmHg (ref 32–48)
pH, Arterial: 7.377 (ref 7.35–7.45)
pO2, Arterial: 338 mmHg — ABNORMAL HIGH (ref 83–108)

## 2021-06-22 LAB — PREPARE RBC (CROSSMATCH)

## 2021-06-22 LAB — ABO/RH: ABO/RH(D): A POS

## 2021-06-22 LAB — SURGICAL PCR SCREEN
MRSA, PCR: NEGATIVE
Staphylococcus aureus: POSITIVE — AB

## 2021-06-22 SURGERY — MRI WITH ANESTHESIA
Anesthesia: General

## 2021-06-22 SURGERY — CRANIOTOMY TUMOR EXCISION
Anesthesia: General | Site: Head | Laterality: Left

## 2021-06-22 MED ORDER — ORAL CARE MOUTH RINSE: 15.0000 mL | Freq: Once | OROMUCOSAL | Status: AC

## 2021-06-22 MED ORDER — SUGAMMADEX SODIUM 200 MG/2ML IV SOLN
INTRAVENOUS | Status: DC | PRN
Start: 2021-06-22 — End: 2021-06-22
  Administered 2021-06-22: 200 mg via INTRAVENOUS

## 2021-06-22 MED ORDER — SODIUM CHLORIDE 0.9% IV SOLUTION: Freq: Once | INTRAVENOUS | Status: DC

## 2021-06-22 MED ORDER — MUPIROCIN 2 % EX OINT
1.0000 "application " | TOPICAL_OINTMENT | Freq: Two times a day (BID) | CUTANEOUS | Status: DC
  Administered 2021-06-22 – 2021-06-24 (×4): 1 via NASAL
  Filled 2021-06-22 (×2): qty 22

## 2021-06-22 MED ORDER — BUPIVACAINE-EPINEPHRINE 0.5% -1:200000 IJ SOLN
INTRAMUSCULAR | Status: AC
  Filled 2021-06-22: qty 1

## 2021-06-22 MED ORDER — ONDANSETRON HCL 4 MG/2ML IJ SOLN
INTRAMUSCULAR | Status: DC | PRN
  Administered 2021-06-22: 4 mg via INTRAVENOUS

## 2021-06-22 MED ORDER — PHENYLEPHRINE HCL-NACL 20-0.9 MG/250ML-% IV SOLN
INTRAVENOUS | Status: DC | PRN
  Administered 2021-06-22: 15 ug/min via INTRAVENOUS

## 2021-06-22 MED ORDER — SODIUM CHLORIDE 0.9 % IV SOLN
0.1000 ug/kg/min | INTRAVENOUS | Status: AC
  Administered 2021-06-22: .2 ug/kg/min via INTRAVENOUS
  Filled 2021-06-22: qty 1000

## 2021-06-22 MED ORDER — ORAL CARE MOUTH RINSE: 15.0000 mL | Freq: Once | OROMUCOSAL | Status: DC

## 2021-06-22 MED ORDER — ONDANSETRON HCL 4 MG PO TABS: 4.0000 mg | ORAL_TABLET | ORAL | Status: DC | PRN

## 2021-06-22 MED ORDER — FENTANYL CITRATE (PF) 250 MCG/5ML IJ SOLN
INTRAMUSCULAR | Status: AC
  Filled 2021-06-22: qty 5

## 2021-06-22 MED ORDER — DOCUSATE SODIUM 100 MG PO CAPS
100.0000 mg | ORAL_CAPSULE | Freq: Two times a day (BID) | ORAL | Status: DC
  Administered 2021-06-23 – 2021-06-24 (×3): 100 mg via ORAL
  Filled 2021-06-22 (×3): qty 1

## 2021-06-22 MED ORDER — SODIUM CHLORIDE 0.9 % IV SOLN: INTRAVENOUS | Status: DC | PRN

## 2021-06-22 MED ORDER — CHLORHEXIDINE GLUCONATE 0.12 % MT SOLN: 15.0000 mL | Freq: Once | OROMUCOSAL | Status: AC

## 2021-06-22 MED ORDER — POTASSIUM CHLORIDE IN NACL 20-0.9 MEQ/L-% IV SOLN
INTRAVENOUS | Status: DC
  Filled 2021-06-22 (×2): qty 1000

## 2021-06-22 MED ORDER — LEVETIRACETAM IN NACL 500 MG/100ML IV SOLN
500.0000 mg | Freq: Once | INTRAVENOUS | Status: AC
Start: 2021-06-22 — End: 2021-06-22
  Administered 2021-06-22: 500 mg via INTRAVENOUS
  Filled 2021-06-22: qty 100

## 2021-06-22 MED ORDER — CEFAZOLIN SODIUM-DEXTROSE 2-3 GM-%(50ML) IV SOLR
INTRAVENOUS | Status: DC | PRN
  Administered 2021-06-22: 2 g via INTRAVENOUS

## 2021-06-22 MED ORDER — ACETAMINOPHEN 325 MG PO TABS
650.0000 mg | ORAL_TABLET | ORAL | Status: DC | PRN
Start: 2021-06-22 — End: 2021-06-24
  Administered 2021-06-23: 650 mg via ORAL
  Filled 2021-06-22: qty 2

## 2021-06-22 MED ORDER — PROMETHAZINE HCL 25 MG PO TABS: 12.5000 mg | ORAL_TABLET | ORAL | Status: DC | PRN

## 2021-06-22 MED ORDER — DEXAMETHASONE SODIUM PHOSPHATE 4 MG/ML IJ SOLN
4.0000 mg | Freq: Four times a day (QID) | INTRAMUSCULAR | Status: DC
  Administered 2021-06-24 (×3): 4 mg via INTRAVENOUS
  Filled 2021-06-22 (×3): qty 1

## 2021-06-22 MED ORDER — CHLORHEXIDINE GLUCONATE CLOTH 2 % EX PADS
6.0000 | MEDICATED_PAD | Freq: Every day | CUTANEOUS | Status: DC
  Administered 2021-06-22 – 2021-06-23 (×2): 6 via TOPICAL

## 2021-06-22 MED ORDER — ROCURONIUM BROMIDE 10 MG/ML (PF) SYRINGE
PREFILLED_SYRINGE | INTRAVENOUS | Status: DC | PRN
Start: 2021-06-22 — End: 2021-06-22
  Administered 2021-06-22 (×2): 50 mg via INTRAVENOUS
  Administered 2021-06-22: 40 mg via INTRAVENOUS
  Administered 2021-06-22: 60 mg via INTRAVENOUS
  Administered 2021-06-22: 40 mg via INTRAVENOUS

## 2021-06-22 MED ORDER — PROPOFOL 10 MG/ML IV BOLUS
INTRAVENOUS | Status: AC
  Filled 2021-06-22: qty 20

## 2021-06-22 MED ORDER — EMPTY CONTAINERS FLEXIBLE MISC
25.0000 g | Freq: Once | Status: AC
  Administered 2021-06-22: 25 g via INTRAVENOUS
  Filled 2021-06-22: qty 100

## 2021-06-22 MED ORDER — ONDANSETRON HCL 4 MG/2ML IJ SOLN: 4.0000 mg | INTRAMUSCULAR | Status: DC | PRN

## 2021-06-22 MED ORDER — MORPHINE SULFATE (PF) 2 MG/ML IV SOLN
2.0000 mg | INTRAVENOUS | Status: DC | PRN
  Administered 2021-06-23: 2 mg via INTRAVENOUS
  Administered 2021-06-23: 4 mg via INTRAVENOUS
  Administered 2021-06-23 (×4): 2 mg via INTRAVENOUS
  Filled 2021-06-22: qty 2
  Filled 2021-06-22 (×5): qty 1

## 2021-06-22 MED ORDER — METOPROLOL SUCCINATE ER 50 MG PO TB24
50.0000 mg | ORAL_TABLET | Freq: Every day | ORAL | Status: DC
Start: 2021-06-22 — End: 2021-06-24
  Administered 2021-06-23: 50 mg via ORAL
  Filled 2021-06-22 (×2): qty 1

## 2021-06-22 MED ORDER — THROMBIN 20000 UNITS EX SOLR: CUTANEOUS | Status: DC | PRN

## 2021-06-22 MED ORDER — DEXAMETHASONE SODIUM PHOSPHATE 10 MG/ML IJ SOLN
INTRAMUSCULAR | Status: DC | PRN
  Administered 2021-06-22: 10 mg via INTRAVENOUS

## 2021-06-22 MED ORDER — HYDROMORPHONE HCL 1 MG/ML IJ SOLN
0.2500 mg | INTRAMUSCULAR | Status: DC | PRN
  Administered 2021-06-22: 0.25 mg via INTRAVENOUS

## 2021-06-22 MED ORDER — HYDROCODONE-ACETAMINOPHEN 5-325 MG PO TABS
1.0000 | ORAL_TABLET | ORAL | Status: DC | PRN
  Administered 2021-06-23 – 2021-06-24 (×3): 1 via ORAL
  Filled 2021-06-22 (×3): qty 1

## 2021-06-22 MED ORDER — LACTATED RINGERS IV SOLN: INTRAVENOUS | Status: DC

## 2021-06-22 MED ORDER — THROMBIN 20000 UNITS EX SOLR
CUTANEOUS | Status: AC
  Filled 2021-06-22: qty 20000

## 2021-06-22 MED ORDER — DEXAMETHASONE SODIUM PHOSPHATE 10 MG/ML IJ SOLN
INTRAMUSCULAR | Status: AC
  Filled 2021-06-22: qty 1

## 2021-06-22 MED ORDER — LABETALOL HCL 5 MG/ML IV SOLN: 10.0000 mg | INTRAVENOUS | Status: DC | PRN

## 2021-06-22 MED ORDER — ESMOLOL HCL 100 MG/10ML IV SOLN
INTRAVENOUS | Status: DC | PRN
  Administered 2021-06-22 (×2): 30 mg via INTRAVENOUS
  Administered 2021-06-22: 40 mg via INTRAVENOUS

## 2021-06-22 MED ORDER — BUPIVACAINE-EPINEPHRINE 0.5% -1:200000 IJ SOLN
INTRAMUSCULAR | Status: DC | PRN
  Administered 2021-06-22: 10 mL

## 2021-06-22 MED ORDER — ONDANSETRON HCL 4 MG/2ML IJ SOLN: 4.0000 mg | Freq: Once | INTRAMUSCULAR | Status: DC | PRN

## 2021-06-22 MED ORDER — DEXAMETHASONE SODIUM PHOSPHATE 10 MG/ML IJ SOLN
6.0000 mg | Freq: Four times a day (QID) | INTRAMUSCULAR | Status: AC
  Administered 2021-06-22 – 2021-06-23 (×4): 6 mg via INTRAVENOUS
  Filled 2021-06-22 (×4): qty 1

## 2021-06-22 MED ORDER — MICROFIBRILLAR COLL HEMOSTAT EX POWD
CUTANEOUS | Status: DC | PRN
  Administered 2021-06-22: 5 g via TOPICAL

## 2021-06-22 MED ORDER — BACITRACIN ZINC 500 UNIT/GM EX OINT
TOPICAL_OINTMENT | CUTANEOUS | Status: AC
  Filled 2021-06-22: qty 28.35

## 2021-06-22 MED ORDER — LIDOCAINE 2% (20 MG/ML) 5 ML SYRINGE
INTRAMUSCULAR | Status: DC | PRN
  Administered 2021-06-22: 60 mg via INTRAVENOUS

## 2021-06-22 MED ORDER — SODIUM CHLORIDE 0.9 % IV SOLN: INTRAVENOUS | Status: DC

## 2021-06-22 MED ORDER — HEMOSTATIC AGENTS (NO CHARGE) OPTIME
TOPICAL | Status: DC | PRN
  Administered 2021-06-22: 1 via TOPICAL

## 2021-06-22 MED ORDER — MICROFIBRILLAR COLL HEMOSTAT EX POWD
CUTANEOUS | Status: AC
  Filled 2021-06-22: qty 5

## 2021-06-22 MED ORDER — GADOBUTROL 1 MMOL/ML IV SOLN
7.0000 mL | Freq: Once | INTRAVENOUS | Status: AC | PRN
  Administered 2021-06-22: 7 mL via INTRAVENOUS

## 2021-06-22 MED ORDER — CEFAZOLIN SODIUM-DEXTROSE 2-4 GM/100ML-% IV SOLN
2.0000 g | Freq: Three times a day (TID) | INTRAVENOUS | Status: AC
  Administered 2021-06-22 – 2021-06-23 (×2): 2 g via INTRAVENOUS
  Filled 2021-06-22 (×2): qty 100

## 2021-06-22 MED ORDER — SODIUM CHLORIDE 0.9 % IV SOLN
0.1000 ug/kg/min | INTRAVENOUS | Status: DC
  Filled 2021-06-22: qty 5000

## 2021-06-22 MED ORDER — PHENYLEPHRINE 80 MCG/ML (10ML) SYRINGE FOR IV PUSH (FOR BLOOD PRESSURE SUPPORT)
PREFILLED_SYRINGE | INTRAVENOUS | Status: DC | PRN
Start: 2021-06-22 — End: 2021-06-22

## 2021-06-22 MED ORDER — LEVETIRACETAM IN NACL 500 MG/100ML IV SOLN: 500.0000 mg | Freq: Two times a day (BID) | INTRAVENOUS | Status: DC

## 2021-06-22 MED ORDER — CHLORHEXIDINE GLUCONATE 0.12 % MT SOLN
OROMUCOSAL | Status: AC
  Administered 2021-06-22: 15 mL via OROMUCOSAL
  Filled 2021-06-22: qty 15

## 2021-06-22 MED ORDER — ACETAMINOPHEN 10 MG/ML IV SOLN
1000.0000 mg | Freq: Once | INTRAVENOUS | Status: DC | PRN
  Administered 2021-06-22: 1000 mg via INTRAVENOUS

## 2021-06-22 MED ORDER — BACITRACIN ZINC 500 UNIT/GM EX OINT
TOPICAL_OINTMENT | CUTANEOUS | Status: DC | PRN
  Administered 2021-06-22: 1 via TOPICAL

## 2021-06-22 MED ORDER — 0.9 % SODIUM CHLORIDE (POUR BTL) OPTIME
TOPICAL | Status: DC | PRN
  Administered 2021-06-22: 3000 mL

## 2021-06-22 MED ORDER — THROMBIN 5000 UNITS EX SOLR
CUTANEOUS | Status: AC
  Filled 2021-06-22: qty 5000

## 2021-06-22 MED ORDER — DEXAMETHASONE SODIUM PHOSPHATE 4 MG/ML IJ SOLN: 4.0000 mg | Freq: Three times a day (TID) | INTRAMUSCULAR | Status: DC

## 2021-06-22 MED ORDER — PROPOFOL 10 MG/ML IV BOLUS
INTRAVENOUS | Status: DC | PRN
  Administered 2021-06-22: 100 mg via INTRAVENOUS
  Administered 2021-06-22: 40 mg via INTRAVENOUS

## 2021-06-22 MED ORDER — MIDAZOLAM HCL 2 MG/2ML IJ SOLN
INTRAMUSCULAR | Status: AC
  Filled 2021-06-22: qty 2

## 2021-06-22 MED ORDER — CHLORHEXIDINE GLUCONATE 0.12 % MT SOLN: 15.0000 mL | Freq: Once | OROMUCOSAL | Status: DC

## 2021-06-22 MED ORDER — HYDROMORPHONE HCL 1 MG/ML IJ SOLN
INTRAMUSCULAR | Status: AC
  Filled 2021-06-22: qty 1

## 2021-06-22 MED ORDER — PANTOPRAZOLE SODIUM 40 MG IV SOLR
40.0000 mg | Freq: Every day | INTRAVENOUS | Status: DC
  Administered 2021-06-22 – 2021-06-23 (×2): 40 mg via INTRAVENOUS
  Filled 2021-06-22 (×2): qty 10

## 2021-06-22 MED ORDER — ACETAMINOPHEN 650 MG RE SUPP: 650.0000 mg | RECTAL | Status: DC | PRN

## 2021-06-22 MED ORDER — LOSARTAN POTASSIUM 50 MG PO TABS
25.0000 mg | ORAL_TABLET | Freq: Every day | ORAL | Status: DC
  Administered 2021-06-23 – 2021-06-24 (×2): 25 mg via ORAL
  Filled 2021-06-22 (×2): qty 1

## 2021-06-22 SURGICAL SUPPLY — 63 items
BAG COUNTER SPONGE SURGICOUNT (BAG) ×5 IMPLANT
BAND RUBBER #18 3X1/16 STRL (MISCELLANEOUS) ×7 IMPLANT
BATTERY IQ STERILE (MISCELLANEOUS) ×3 IMPLANT
BIT DRILL WIRE PASS 1.3MM (BIT) IMPLANT
BLADE CLIPPER SPEC (BLADE) ×1 IMPLANT
BUR PRECISION FLUTE 6.0 (BURR) ×3 IMPLANT
BUR SPIRAL ROUTER 2.3 (BUR) ×1 IMPLANT
CANISTER SUCT 3000ML PPV (MISCELLANEOUS) ×6 IMPLANT
CARTRIDGE OIL MAESTRO DRILL (MISCELLANEOUS) ×2 IMPLANT
CATH VENTRIC 35X38 W/TROCAR LG (CATHETERS) IMPLANT
CLIP VESOCCLUDE MED 6/CT (CLIP) IMPLANT
CNTNR URN SCR LID CUP LEK RST (MISCELLANEOUS) ×2 IMPLANT
CONT SPEC 4OZ STRL OR WHT (MISCELLANEOUS) ×2
COVER BACK TABLE 60X90IN (DRAPES) ×1 IMPLANT
DIFFUSER DRILL AIR PNEUMATIC (MISCELLANEOUS) ×3 IMPLANT
DRAPE NEUROLOGICAL W/INCISE (DRAPES) ×3 IMPLANT
DRAPE WARM FLUID 44X44 (DRAPES) ×3 IMPLANT
DRILL WIRE PASS 1.3MM (BIT)
ELECT REM PT RETURN 9FT ADLT (ELECTROSURGICAL) ×3
ELECTRODE REM PT RTRN 9FT ADLT (ELECTROSURGICAL) ×2 IMPLANT
EVACUATOR 1/8 PVC DRAIN (DRAIN) IMPLANT
EVACUATOR SILICONE 100CC (DRAIN) IMPLANT
GAUZE 4X4 16PLY ~~LOC~~+RFID DBL (SPONGE) IMPLANT
GAUZE SPONGE 4X4 12PLY STRL (GAUZE/BANDAGES/DRESSINGS) ×1 IMPLANT
GLOVE BIO SURGEON STRL SZ8 (GLOVE) ×5 IMPLANT
GLOVE BIO SURGEON STRL SZ8.5 (GLOVE) ×3 IMPLANT
GOWN STRL REUS W/ TWL LRG LVL3 (GOWN DISPOSABLE) IMPLANT
GOWN STRL REUS W/ TWL XL LVL3 (GOWN DISPOSABLE) ×2 IMPLANT
GOWN STRL REUS W/TWL LRG LVL3 (GOWN DISPOSABLE) ×3
GOWN STRL REUS W/TWL XL LVL3 (GOWN DISPOSABLE) ×1
HEMOSTAT POWDER KIT SURGIFOAM (HEMOSTASIS) ×1 IMPLANT
HEMOSTAT SURGICEL 2X14 (HEMOSTASIS) ×3 IMPLANT
HEMOSTATIC FLOUR AVITENE 5GM (HEMOSTASIS) ×1 IMPLANT
KIT BASIN OR (CUSTOM PROCEDURE TRAY) ×3 IMPLANT
KIT DRAIN CSF ACCUDRAIN (MISCELLANEOUS) IMPLANT
KIT TURNOVER KIT B (KITS) ×3 IMPLANT
MARKER SKIN DUAL TIP RULER LAB (MISCELLANEOUS) ×2 IMPLANT
MARKER SPHERE PSV REFLC 13MM (MARKER) ×6 IMPLANT
NEEDLE HYPO 22GX1.5 SAFETY (NEEDLE) ×3 IMPLANT
NS IRRIG 1000ML POUR BTL (IV SOLUTION) ×5 IMPLANT
OIL CARTRIDGE MAESTRO DRILL (MISCELLANEOUS) ×3
PACK CRANIOTOMY CUSTOM (CUSTOM PROCEDURE TRAY) ×3 IMPLANT
PIN MAYFIELD SKULL DISP (PIN) ×1 IMPLANT
PLATE CRANIAL 12 2H RIGID UNI (Plate) ×3 IMPLANT
SCREW UNIII AXS SD 1.5X4 (Screw) ×6 IMPLANT
SET CARTRIDGE AND TUBING (SET/KITS/TRAYS/PACK) ×1 IMPLANT
SPONGE NEURO XRAY DETECT 1X3 (DISPOSABLE) ×1 IMPLANT
SPONGE SURGIFOAM ABS GEL 100 (HEMOSTASIS) ×3 IMPLANT
STAPLER SKIN PROX WIDE 3.9 (STAPLE) ×3 IMPLANT
SUT NURALON 4 0 TR CR/8 (SUTURE) ×5 IMPLANT
SUT PROLENE 6 0 BV (SUTURE) IMPLANT
SUT SILK 0 TIES 10X30 (SUTURE) IMPLANT
SUT VIC AB 2-0 CP2 18 (SUTURE) ×4 IMPLANT
SUT VIC AB 3-0 FS2 27 (SUTURE) IMPLANT
SUT VICRYL 4-0 PS2 18IN ABS (SUTURE) IMPLANT
TAPE CLOTH SURG 4X10 WHT LF (GAUZE/BANDAGES/DRESSINGS) ×1 IMPLANT
TIP STANDARD 36KHZ (INSTRUMENTS) ×3
TIP STD 36KHZ (INSTRUMENTS) IMPLANT
TOWEL GREEN STERILE (TOWEL DISPOSABLE) ×4 IMPLANT
TOWEL GREEN STERILE FF (TOWEL DISPOSABLE) ×3 IMPLANT
UNDERPAD 30X36 HEAVY ABSORB (UNDERPADS AND DIAPERS) IMPLANT
WATER STERILE IRR 1000ML POUR (IV SOLUTION) ×3 IMPLANT
WRENCH TORQUE 36KHZ (INSTRUMENTS) ×1 IMPLANT

## 2021-06-22 NOTE — Progress Notes (Signed)
Subjective: ?The patient is alert but confused.  He is in no apparent distress.  He was not able to get his BrainLab MRI yesterday because he could not lie still. ? ?Objective: ?Vital signs in last 24 hours: ?Temp:  [97.9 ?F (36.6 ?C)-98.9 ?F (37.2 ?C)] 97.9 ?F (36.6 ?C) (05/10 0754) ?Pulse Rate:  [29-109] 67 (05/10 0754) ?Resp:  [20-26] 20 (05/10 0754) ?BP: (107-147)/(78-99) 107/84 (05/10 0754) ?SpO2:  [90 %-98 %] 98 % (05/10 0754) ?Weight:  [69.7 kg] 69.7 kg (05/10 0500) ?Estimated body mass index is 22.05 kg/m? as calculated from the following: ?  Height as of this encounter: '5\' 10"'$  (1.778 m). ?  Weight as of this encounter: 69.7 kg. ? ? ?Intake/Output from previous day: ?05/09 0701 - 05/10 0700 ?In: -  ?Out: 3550 [[id number]] ?Intake/Output this shift: ?No intake/output data recorded. ? ?Physical exam the patient is alert.  He will answer simple questions.  He is oriented only to person.  He is moving all 4 extremities. ? ?Lab Results: ?Recent Labs  ?  06/20/21 ?1021  ?WBC 8.3  ?HGB 14.4  ?HCT 42.8  ?PLT 298  ? ?BMET ?Recent Labs  ?  06/20/21 ?1021  ?NA 137  ?K 4.1  ?CL 104  ?CO2 26  ?GLUCOSE 112*  ?BUN 15  ?CREATININE 0.72  ?CALCIUM 8.9  ? ? ?Studies/Results: ?CT Head Wo Contrast ? ?Result Date: 06/20/2021 ?CLINICAL DATA:  Mental status change, unknown cause thank you empty capped and obvious EXAM: CT HEAD WITHOUT CONTRAST TECHNIQUE: Contiguous axial images were obtained from the base of the skull through the vertex without intravenous contrast. RADIATION DOSE REDUCTION: This exam was performed according to the departmental dose-optimization program which includes automated exposure control, adjustment of the mA and/or kV according to patient size and/or use of iterative reconstruction technique. COMPARISON:  None Available. FINDINGS: Brain: Masslike, somewhat hyperdense area in the left frontal and temporal lobe, which measures approximately 6.2 x 4.6 x 4.4 cm (series 2, image 16 and series 4, image 43),  although the borders are difficult to delineate. There areas of hyperdensity within the mass, most likely hemorrhage. Surrounding hypodensity, likely edema. Mass effect, effacing the frontal horn, body, and temporal horn of the left lateral ventricle, as well as the third ventricle, with suspected resulting enlargement of the right ventricle and left occipital horn. Mass effect on the midbrain, with likely effacement of the ambient cisterns. Approximately 12 mm of left-to-right midline shift. No extra-axial collection. Vascular: No hyperdense vessel. Skull: Normal. Negative for fracture or focal lesion. Sinuses/Orbits: No acute finding. Other: Trace fluid in the right-greater-than-left mastoid air cells. IMPRESSION: Masslike area in the left frontal and temporal lobe, favored to represent a primary neoplasm with areas of internal hemorrhage, with a large infarct with hemorrhagic transformation felt to be less likely. This causes significant surrounding edema and mass effect, with 12 mm of left-to-right midline shift, as well as effacement of left lateral ventricle and third ventricle, with likely enlargement of the right lateral ventricle and left occipital horn. An MRI with and without contrast is recommended. These results were called by telephone at the time of interpretation on 06/20/2021 at 11:18 am to provider [facility] , who verbally acknowledged these results. Electronically Signed   By: Merilyn Baba M.D.   On: 06/20/2021 11:19  ? ?MR Brain W and Wo Contrast ? ?Result Date: 06/20/2021 ?CLINICAL DATA:  Brain/CNS neoplasm, staging. Abnormal head CT. Altered mental status. EXAM: MRI HEAD WITHOUT AND WITH CONTRAST TECHNIQUE: Multiplanar,  multiecho pulse sequences of the brain and surrounding structures were obtained without and with intravenous contrast. CONTRAST:  65m GADAVIST GADOBUTROL 1 MMOL/ML IV SOLN COMPARISON:  Head CT 06/20/2021 FINDINGS: Brain: A heterogeneously enhancing mass involving the left  temporal lobe, frontal lobe, and insula measures 6.0 x 5.0 cm with areas of internal necrosis and blood products. There is involvement of the cortex which is thickened. An adjacent heterogeneously enhancing satellite nodule in the left temporal lobe measures 2.0 cm. There is a moderate amount of nonenhancing T2 hyperintensity in the left frontotemporal white matter which may reflect edema and possibly nonenhancing tumor. There is mass effect on the left basal ganglia, left lateral ventricle, and third ventricle with rightward midline shift of 1.3 cm. The right lateral ventricle appears mildly dilated. There is also prominent mass effect on the left midbrain. A few punctate foci of T2 hyperintensity are noted elsewhere in the cerebral white matter bilaterally and in the pons, nonspecific but compatible with chronic small vessel ischemia. No acute infarct or extra-axial fluid collection is identified. Vascular: Major intracranial vascular flow voids are preserved. Displacement of the left MCA by the mass. Skull and upper cervical spine: Unremarkable bone marrow signal. Sinuses/Orbits: Unremarkable orbits. Clear paranasal sinuses. Small bilateral mastoid effusions. Other: None. 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 Regional mass effect and edema with 1.3 cm of rightward midline shift and mild dilatation of the right lateral ventricle. Electronically Signed   By: ALogan BoresM.D.   On: 06/20/2021 12:49   ? ?Assessment/Plan: ?Left brain tumor: He was not able to get his BrainLab protocol brain MRI yesterday.  We will plan to get a brain MRI under general anesthesia and take him directly to the OR from the MRI scanner. ? LOS: 2 days  ? ? ? ?Todd [name]?06/22/2021, 10:25 AM ? ? ? ? ?Patient ID: SJermarion Poffenberger male   DOB: 909/26/1962 61y.o.   MRN: [id number]? ?

## 2021-06-22 NOTE — Progress Notes (Signed)
Subjective: ?The patient is somnolent. ? ?Objective: ?Vital signs in last 24 hours: ?Temp:  [97.9 ?F (36.6 ?C)-98.9 ?F (37.2 ?C)] 97.9 ?F (36.6 ?C) (05/10 1131) ?Pulse Rate:  [29-90] 80 (05/10 1131) ?Resp:  [19-26] 19 (05/10 1131) ?BP: (107-141)/(78-90) 125/79 (05/10 1131) ?SpO2:  [90 %-98 %] 97 % (05/10 1131) ?Weight:  [69.7 kg] 69.7 kg (05/10 0500) ?Estimated body mass index is 22.05 kg/m? as calculated from the following: ?  Height as of this encounter: '5\' 10"'$  (1.778 m). ?  Weight as of this encounter: 69.7 kg. ? ? ?Intake/Output from previous day: ?05/09 0701 - 05/10 0700 ?In: -  ?Out: 3550 [[id number]] ?Intake/Output this shift: ?No intake/output data recorded. ? ?Physical exam the patient is somnolent but arousable.  He seems to moving all 4 extremities.  His left pupil is approximately 5 mm, his right pupil 3 mm. ? ?Lab Results: ?Recent Labs  ?  06/20/21 ?1021  ?WBC 8.3  ?HGB 14.4  ?HCT 42.8  ?PLT 298  ? ?BMET ?Recent Labs  ?  06/20/21 ?1021  ?NA 137  ?K 4.1  ?CL 104  ?CO2 26  ?GLUCOSE 112*  ?BUN 15  ?CREATININE 0.72  ?CALCIUM 8.9  ? ? ?Studies/Results: ?MR BRAIN W WO CONTRAST ? ?Result Date: 06/22/2021 ?CLINICAL DATA:  Brain/CNS neoplasm, monitor EXAM: MRI HEAD WITHOUT AND WITH CONTRAST TECHNIQUE: Multiplanar, multiecho pulse sequences of the brain and surrounding structures were obtained without and with intravenous contrast. CONTRAST:  77m GADAVIST GADOBUTROL 1 MMOL/ML IV SOLN COMPARISON:  MRI head Jun 20, 2021. FINDINGS: Brain: In comparison to recent MRI head, no substantial change in a large, heterogeneously enhancing mass involving the left temporal and frontal lobes and insula. Similar cortical thickening and adjacent satellite nodule, detailed on the prior. Similar moderate surrounding T2/stir hyperintense signal. Similar mass effect on the left basal ganglia, left lateral ventricle and third ventricle with similar approximately 1.2 cm of rightward midline shift. Similar mass effect on the midbrain.  Mild dilation of the right lateral ventricle is unchanged. No evidence of acute infarct. Vascular: Major intracranial vessels are maintained at the skull base. Displacement left MCA vessels by the mass. Skull and upper cervical spine: Normal marrow signal. Sinuses/Orbits: Clear sinuses.  No acute orbital findings. Other: Small bilateral mastoid effusions. 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 Similar regional mass effect and edema with 1.2 cm of rightward midline shift. Similar mild dilation the right lateral ventricle, which may represent early ventricular entrapment. Electronically Signed   By: FMargaretha SheffieldM.D.   On: 06/22/2021 14:31   ? ?Assessment/Plan: ?Status post craniotomy: I spoke with the patient's wife.  I have answered all her questions.  Hopefully he will continue to proved.  It does not surprise me he has some anisocoria given the uncal herniation and working around the left 3rd nerve. ? LOS: 2 days  ? ? ? ?JOphelia Charter?06/22/2021, 7:02 PM ? ? ? ? ? ?

## 2021-06-22 NOTE — Anesthesia Postprocedure Evaluation (Signed)
Anesthesia Post Note ? ?Patient: Todd [name] ? ?Procedure(s) Performed: MRI BRAIN  WITH ANESTHESIA ? ?  ? ?Patient location during evaluation: PACU ?Anesthesia Type: General ?Level of consciousness: awake ?Pain management: pain level controlled ?Vital Signs Assessment: post-procedure vital signs reviewed and stable ?Respiratory status: spontaneous breathing, nonlabored ventilation, respiratory function stable and patient connected to nasal cannula oxygen ?Cardiovascular status: blood pressure returned to baseline, stable and tachycardic ?Postop Assessment: no apparent nausea or vomiting ?Anesthetic complications: no ? ? ?No notable events documented. ? ?Last Vitals:  ?Vitals:  ? 06/22/21 1930 06/22/21 1945  ?BP: 104/82 (!) 123/92  ?Pulse: (!) 123 100  ?Resp: (!) 23 18  ?Temp:  36.4 ?C  ?SpO2: 97% 99%  ?  ?Last Pain:  ?Vitals:  ? 06/22/21 1930  ?TempSrc:   ?PainSc: 0-No pain  ? ? ?  ?  ?  ?  ?  ?  ? ?Audry Pili ? ? ? ? ?

## 2021-06-22 NOTE — Transfer of Care (Signed)
Immediate Anesthesia Transfer of Care Note ? ?Patient: Todd [name] ? ?Procedure(s) Performed: MRI BRAIN  WITH ANESTHESIA ? ?Patient Location: PACU ? ?Anesthesia Type:General ? ?Level of Consciousness: drowsy and responds to stimulation ? ?Airway & Oxygen Therapy: Patient Spontanous Breathing ? ?Post-op Assessment: Report given to RN and Post -op Vital signs reviewed and stable ? ?Post vital signs: Reviewed and stable ? ?Last Vitals:  ?Vitals Value Taken Time  ?BP 134/85 06/22/21 1856  ?Temp    ?Pulse 102 06/22/21 1859  ?Resp 21 06/22/21 1859  ?SpO2 97 % 06/22/21 1859  ?Vitals shown include unvalidated device data. ? ?Last Pain:  ?Vitals:  ? 06/22/21 0754  ?TempSrc: Axillary  ?PainSc:   ?   ? ?  ? ?Complications: No notable events documented. ?

## 2021-06-22 NOTE — Anesthesia Preprocedure Evaluation (Deleted)
Anesthesia Evaluation  ?Patient identified by MRN, date of birth, ID band ?Patient confused ? ?General Assessment Comment:Probable glioblastoma ?Mental status changes ? ?Reviewed: ?Allergy & Precautions, NPO status , Patient's Chart, lab work & pertinent test results, Unable to perform ROS - Chart review only ? ?Airway ?Mallampati: II ? ?TM Distance: >3 FB ?Neck ROM: Full ? ? ? Dental ?no notable dental hx. ? ?  ?Pulmonary ?neg pulmonary ROS, former smoker,  ?  ?Pulmonary exam normal ?breath sounds clear to auscultation ? ? ? ? ? ? Cardiovascular ?hypertension, Normal cardiovascular exam ?Rhythm:Regular Rate:Normal ? ? ?  ?Neuro/Psych ?Depression negative neurological ROS ?   ? GI/Hepatic ?negative GI ROS, Neg liver ROS,   ?Endo/Other  ?negative endocrine ROS ? Renal/GU ?negative Renal ROS  ?negative genitourinary ?  ?Musculoskeletal ?negative musculoskeletal ROS ?(+)  ? Abdominal ?  ?Peds ?negative pediatric ROS ?(+)  Hematology ?negative hematology ROS ?(+)   ?Anesthesia Other Findings ? ? Reproductive/Obstetrics ?negative OB ROS ? ?  ? ? ? ? ? ? ? ? ? ? ? ? ? ?  ?  ? ? ? ? ? ? ? ? ?Anesthesia Physical ?Anesthesia Plan ? ?ASA: 3 ? ?Anesthesia Plan: General  ? ?Post-op Pain Management: Dilaudid IV  ? ?Induction: Intravenous ? ?PONV Risk Score and Plan: 2 and Ondansetron, Dexamethasone and Treatment may vary due to age or medical condition ? ?Airway Management Planned: Oral ETT ? ?Additional Equipment: Arterial line ? ?Intra-op Plan:  ? ?Post-operative Plan: Possible Post-op intubation/ventilation ? ?Informed Consent: I have reviewed the patients History and Physical, chart, labs and discussed the procedure including the risks, benefits and alternatives for the proposed anesthesia with the patient or authorized representative who has indicated his/her understanding and acceptance.  ? ? ? ?Dental advisory given ? ?Plan Discussed with: CRNA and Surgeon ? ?Anesthesia Plan Comments:    ? ? ? ? ? ? ?Anesthesia Quick Evaluation ? ?

## 2021-06-22 NOTE — Progress Notes (Addendum)
Wrist restraints and soft mitts in place up arrival to pre-op. Removed restraints as they appeared to be too tight. Performed skin assessment on both wrists. Both wrists were very red, the skin did not appear to be broken at that time. Foam dressing applied to bilateral wrists. Right wrist had indentation from restraint covering IV site. IV site was leaking and catheter 50% removed, which was discovered when the gause wrap was removed. IV was wrapped in gauze wrap which was saturated with serousanguenous drainage. IV was removed from said site and placed above wrist restraint locations. Foam dressings applied to bilateral wrist to protect the reddened, edematous skin.  Restraints and soft mitts reapplied.  ?

## 2021-06-22 NOTE — Anesthesia Preprocedure Evaluation (Signed)
Anesthesia Evaluation  ?Patient identified by MRN, date of birth, ID band ?Patient confused ? ? ? ?Reviewed: ?Allergy & Precautions, NPO status , Patient's Chart, lab work & pertinent test results ? ?History of Anesthesia Complications ?Negative for: history of anesthetic complications ? ?Airway ?Mallampati: III ? ?TM Distance: >3 FB ?Neck ROM: Full ? ? ? Dental ? ?(+) Missing, Dental Advisory Given ?  ?Pulmonary ?former smoker,  ?  ?breath sounds clear to auscultation ? ? ? ? ? ? Cardiovascular ?hypertension, +CHF  ? ?Rhythm:Regular  ? ?  ?Neuro/Psych ?PSYCHIATRIC DISORDERS Anxiety Depression CRANIAL TUMOR ?  ? GI/Hepatic ?  ?Endo/Other  ? ? Renal/GU ?  ? ?  ?Musculoskeletal ? ? Abdominal ?  ?Peds ? Hematology ?  ?Anesthesia Other Findings ? ? Reproductive/Obstetrics ? ?  ? ? ? ? ? ? ? ? ? ? ? ? ? ?  ?  ? ? ? ? ? ? ? ? ?Anesthesia Physical ?Anesthesia Plan ? ?ASA: 4 ? ?Anesthesia Plan: General  ? ?Post-op Pain Management: Minimal or no pain anticipated  ? ?Induction: Intravenous ? ?PONV Risk Score and Plan: 2 and Ondansetron and Dexamethasone ? ?Airway Management Planned: Oral ETT ? ?Additional Equipment: None ? ?Intra-op Plan:  ? ?Post-operative Plan: Post-operative intubation/ventilation ? ?Informed Consent:  ? ? ? ?Dental advisory given ? ?Plan Discussed with: CRNA ? ?Anesthesia Plan Comments:   ? ? ? ? ? ? ?Anesthesia Quick Evaluation ? ?

## 2021-06-22 NOTE — Anesthesia Procedure Notes (Signed)
Arterial Line Insertion ?Start/End5/11/2021 2:35 PM, 06/22/2021 2:39 PM ?Performed by: CRNA ? Patient location: OR. ?Preanesthetic checklist: patient identified, IV checked, site marked, risks and benefits discussed, surgical consent, monitors and equipment checked, pre-op evaluation, timeout performed and anesthesia consent ?Patient sedated ?Left, radial was placed ?Catheter size: 20 G ?Hand hygiene performed  and maximum sterile barriers used  ? ?Attempts: 2 ?Procedure performed without using ultrasound guided technique. ?Following insertion, dressing applied and Biopatch. ?Post procedure assessment: normal and unchanged ? ?Patient tolerated the procedure well with no immediate complications. ? ? ?

## 2021-06-22 NOTE — Consult Note (Incomplete)
? ?NAME:  Todd [name], MRN:  [id number], DOB:  December 24, 1960, LOS: 2 ?ADMISSION DATE:  06/20/2021, CONSULTATION DATE:  5/10 ?REFERRING MD:  Arnoldo Morale, CHIEF COMPLAINT:  post op Critical care  ? ?History of Present Illness:  ?This is a 61 year old male patient with history as presented Below.  Presented to the emergency room on 5/8 with chief complaint of weakness, intermittent confusion, erratic behavior, and falling at home.  Had been treated in the outpatient setting for depression.  In the emergency room a CT of head was obtained raising concern for high-grade glioma with associated cerebral edema with associated midline shift brainstem compression and uncal herniation.  Neurosurgery was consulted, and the patient was admitted to their service.  He was started on IV Keppra and Decadron.  Over the following days treatment options were discussed, ultimately the patient was brought to the operating room for elective palliative craniotomy.  Pulmonary critical care asked to assist with postop management ?Pertinent  Medical History  ?Congestive heart failure, hypertension, remote history of alcohol abuse. ? ?Significant Hospital Events: ?Including procedures, antibiotic start and stop dates in addition to other pertinent events   ?5/8 admitted, CT head with masslike area in the left frontal temporal lobe favoring neoplasm with areas of internal hemorrhage, significant cerebral edema with mass effect and 12 mm left to right midline shift with effacement of the left lateral ventricle, third ventricle and right lateral ventricle, MRI 6 cm left frontal temporal mass concerning for high grade glioma ?5/10 repeat MRI obtained, patient brought to operating room for craniotomy ? ?Interim History / Subjective:  ?*** ? ?Objective   ?Blood pressure 125/79, pulse 80, temperature 97.9 ?F (36.6 ?C), resp. rate 19, height '5\' 10"'$  (1.778 m), weight 69.7 kg, SpO2 97 %. ?   ?   ? ?Intake/Output Summary (Last 24 hours) at 06/22/2021  1321 ?Last data filed at 06/22/2021 1234 ?Gross per 24 hour  ?Intake 100 ml  ?Output 3550 ml  ?Net -3450 ml  ? ?Filed Weights  ? 06/20/21 0957 06/21/21 0421 06/22/21 0500  ?Weight: 72.6 kg 71.7 kg 69.7 kg  ? ? ?Examination: ?General: *** ?HENT: *** ?Lungs: *** ?Cardiovascular: *** ?Abdomen: *** ?Extremities: *** ?Neuro: *** ?GU: *** ? ?Resolved Hospital Problem list   ?*** ? ?Assessment & Plan:  ?Principal Problem: ?  Brain tumor ([place]) ?Active Problems: ?  Essential hypertension ?  CHF (congestive heart failure) ([place]) ?  Dizziness ? ? ?Postoperative left craniotomy for brain tumor (working Dx glioblastoma) ?Plan ?Routine postoperative care per neurosurgery ?Continuing prophylactic anticonvulsants ?Continuing Decadron per neurosurgery ?Serial neurochecks ?Will need oncology consult ? ?Respiratory failure: ** ?Plan ? ?History of heart failure, and hypertension ?Plan ?Holding antihypertensive for now ?Assess daily for resuming ? ? ? ?Best Practice (right click and "Reselect all SmartList Selections" daily)  ? ?Diet/type: {diet type:25684} ?DVT prophylaxis: {anticoagulation (Optional):25687} ?GI prophylaxis: {YQ:65784} ?Lines: {Central Venous Access:25771} ?Foley:  {Central Venous Access:25691} ?Code Status:  {Code Status:26939} ?Last date of multidisciplinary goals of care discussion [***] ? ?Labs   ?CBC: ?Recent Labs  ?Lab 06/20/21 ?1021  ?WBC 8.3  ?HGB 14.4  ?HCT 42.8  ?MCV 92.6  ?PLT 298  ? ? ?Basic Metabolic Panel: ?Recent Labs  ?Lab 06/20/21 ?1021  ?NA 137  ?K 4.1  ?CL 104  ?CO2 26  ?GLUCOSE 112*  ?BUN 15  ?CREATININE 0.72  ?CALCIUM 8.9  ? ?GFR: ?Estimated Creatinine Clearance: 98 mL/min (by C-G formula based on SCr of 0.72 mg/dL). ?Recent Labs  ?Lab 06/20/21 ?1021  ?  WBC 8.3  ? ? ?Liver Function Tests: ?No results for input(s): AST, ALT, ALKPHOS, BILITOT, PROT, ALBUMIN in the last 168 hours. ?No results for input(s): LIPASE, AMYLASE in the last 168 hours. ?No results for input(s): AMMONIA in the last 168  hours. ? ?ABG ?No results found for: PHART, PCO2ART, PO2ART, HCO3, TCO2, ACIDBASEDEF, O2SAT  ? ?Coagulation Profile: ?Recent Labs  ?Lab 06/20/21 ?2123  ?INR 1.0  ? ? ?Cardiac Enzymes: ?No results for input(s): CKTOTAL, CKMB, CKMBINDEX, TROPONINI in the last 168 hours. ? ?HbA1C: ?No results found for: HGBA1C ? ?CBG: ?Recent Labs  ?Lab 06/20/21 ?1006  ?GLUCAP 106*  ? ? ?Review of Systems:   ?*** ? ?Past Medical History:  ?He,  has a past medical history of Anxiety, CHF (congestive heart failure) ([place]), and Depression.  ? ?Surgical History:  ?History reviewed. No pertinent surgical history.  ? ?Social History:  ? reports that he has quit smoking. He has never used smokeless tobacco. He reports that he does not drink alcohol and does not use drugs.  ? ?Family History:  ?His family history includes Cancer in his mother; Dementia in his father.  ? ?Allergies ?No Known Allergies  ? ?Home Medications  ?Prior to Admission medications   ?Medication Sig Start Date End Date Taking? Authorizing Provider  ?aspirin EC 81 MG tablet Take 81 mg by mouth daily. Swallow whole.   Yes [provider]  ?Chlorpheniramine-DM (COUGH & COLD PO) Take 5 mLs by mouth every 6 (six) hours as needed (cough).   Yes [provider]  ?diphenhydrAMINE HCl, Sleep, (ZZZQUIL PO) Take 30 mLs by mouth daily as needed (takes at bedtime for sleep).   Yes [provider]  ?escitalopram (LEXAPRO) 10 MG tablet Take 1 tablet (10 mg total) by mouth daily. 05/23/21  Yes Lindell Spar, MD  ?losartan (COZAAR) 25 MG tablet Take 1 tablet (25 mg total) by mouth daily. 03/21/21  Yes Lindell Spar, MD  ?metoprolol succinate (TOPROL XL) 50 MG 24 hr tablet Take 1 tablet (50 mg total) by mouth daily. Take with or immediately following a meal. 03/21/21  Yes Lindell Spar, MD  ?  ? ?Critical care time: *** ?  ? ? ? ? ? ?

## 2021-06-22 NOTE — Anesthesia Procedure Notes (Signed)
Procedure Name: Intubation ?Date/Time: 06/22/2021 12:45 PM ?Performed by: Rande Brunt, CRNA ?Pre-anesthesia Checklist: Patient identified, Emergency Drugs available, Suction available and Patient being monitored ?Patient Re-evaluated:Patient Re-evaluated prior to induction ?Oxygen Delivery Method: Circle System Utilized ?Preoxygenation: Pre-oxygenation with 100% oxygen ?Induction Type: IV induction ?Ventilation: Mask ventilation without difficulty ?Laryngoscope Size: Mac and 4 ?Grade View: Grade II ?Tube type: Oral ?Tube size: 7.5 mm ?Number of attempts: 1 ?Airway Equipment and Method: Stylet and Oral airway ?Placement Confirmation: ETT inserted through vocal cords under direct vision, positive ETCO2 and breath sounds checked- equal and bilateral ?Secured at: 22 cm ?Tube secured with: Tape ?Dental Injury: Teeth and Oropharynx as per pre-operative assessment  ? ? ? ? ?

## 2021-06-22 NOTE — Op Note (Signed)
Brief history: The patient is a 61 year old white male who presented with confusion, mental status changes, dysphagia/aphasia.  He was worked up with a head CT and brain MRI which demonstrated a large left temporal tumor with severe brain compression/midline shift consistent with a malignant tumor.  I discussed the situation with the patient's wife.  We discussed the various treatment options.  She has decided to proceed with surgery after weighing the risk, benefits and alternatives. ? ?Preop diagnosis: Left brain tumor ? ?Postop diagnosis: The same ? ?Procedure: Left frontal temporal parietal craniotomy for resection of left temporal brain tumor using BrainLab navigation ? ?Surgeon: Dr. Earle Gell ? ?Assistant: Arnetha Massy, NP ? ?Anesthesia: General tracheal ? ?Estimated blood loss: 200 cc ? ?Specimens: Tumor ? ?Drains: None ? ?Complications: None ? ?Description of procedure: The patient was brought to the OR from the MRI scanner intubated.  The patient remained in supine position.  We placed a roll under his left shoulder.  I applied the [place] three-point headrest to his calvarium.  His head was turned to the right exposing his right scalp.  We inserted the patient's calvarium to the BrainLab neuro navigation computer. ? ?The patient's left scalp was then shaved with clippers and prepared with Betadine scrub and Betadine solution.  Sterile drapes were applied.  I injected the area to be incised with Marcaine with epinephrine solution.  Next a scalpel to make a large pterional type incision.  We used Raney clips for wound edge hemostasis.  We used electrocautery to divide the temporalis fascia and muscle.  We then used electrocautery and the periosteal elevator to expose the patient's left frontal temporal parietal cranium.  We used towel clamps and rubber bands for exposure.  I then used a high-speed drill to create a inferior temporal and posterior temporal bur hole.  We then used the footplate device to  create a left frontal temporal parietal craniotomy flap.  I elevated the craniotomy flap with the periosteal elevator exposing underlying dura.  We then incised the dura with a 15 blade scalpel and continued the incision using Metzenbaum scissors.  We tacked back the dural edges. ? ?I used the BrainLab neuro navigation to confirm the good position of the craniotomy and the position of the underlying tumor.  I then used electrocautery to perform a corticotomy over the tumor.  I used bipolar cautery, suction, irrigation to expose the underlying tumor.  We used the body halo for retraction.  It appeared to be a malignant primary tumor.  We sent specimens to the pathologist to confirm this.  I then used suction, irrigation, bipolar electrocautery and the [place] to debulk the tumor.  I performed a left anterior temporal lobectomy to further decompress the patient's brain/brainstem.  I identified the tentorium and resected the uncus which had already herniated across the tentorium.  This decompressed the patient's brainstem.  We identified the 3rd nerve.  It was decompressed.  I then continued to use the Cusa, suction and the bipolar electrocautery to further debulk the tumor. ? ?After I was satisfied with the resection of the tumor we confirmed the good resection using the BrainLab neuro navigation.  We then obtained hemostasis using bipolar cautery, Avitene flour, and Surgicel.  We then reapproximated the patient's dura with 4-0 Nurolon suture.  We placed a central tack up suture through the craniotomy flap.  We replaced the craniotomy flap with titanium mini plates and screws.  We then remove the retractor and reapproximated patient's temporalis fascia and muscle  with interrupted 2-0 Vicryl suture.  We reapproximated the galea with interrupted 2-0 Vicryl suture.  We then reapproximated the skin with stainless steel staples.  The wound was then coated with bacitracin ointment.  A sterile dressing was applied.  The drapes  were removed.  I then remove the [place] three-point headrest from the patient calvarium.  By report all sponge, instrument, and needle counts were correct. ?

## 2021-06-23 ENCOUNTER — Encounter (HOSPITAL_COMMUNITY): Payer: Self-pay | Admitting: Neurosurgery

## 2021-06-23 LAB — BASIC METABOLIC PANEL
Anion gap: 5 (ref 5–15)
BUN: 33 mg/dL — ABNORMAL HIGH (ref 6–20)
CO2: 23 mmol/L (ref 22–32)
Calcium: 8.4 mg/dL — ABNORMAL LOW (ref 8.9–10.3)
Chloride: 113 mmol/L — ABNORMAL HIGH (ref 98–111)
Creatinine, Ser: 1.12 mg/dL (ref 0.61–1.24)
GFR, Estimated: >60 mL/min (ref >60)
Glucose, Bld: 127 mg/dL — ABNORMAL HIGH (ref 70–99)
Potassium: 4 mmol/L (ref 3.5–5.1)
Sodium: 141 mmol/L (ref 135–145)

## 2021-06-23 LAB — CBC
HCT: 36.9 % — ABNORMAL LOW (ref 39.0–52.0)
Hemoglobin: 12.6 g/dL — ABNORMAL LOW (ref 13.0–17.0)
MCH: 31.9 pg (ref 26.0–34.0)
MCHC: 34.1 g/dL (ref 30.0–36.0)
MCV: 93.4 fL (ref 80.0–100.0)
Platelets: 259 10*3/uL (ref 150–400)
RBC: 3.95 MIL/uL — ABNORMAL LOW (ref 4.22–5.81)
RDW: 12.4 % (ref 11.5–15.5)
WBC: 17.3 10*3/uL — ABNORMAL HIGH (ref 4.0–10.5)
nRBC: 0 % (ref 0.0–0.2)

## 2021-06-23 LAB — URINE CULTURE: Culture: 60000 — AB

## 2021-06-23 MED ORDER — LIDOCAINE HCL URETHRAL/MUCOSAL 2 % EX GEL
1.0000 "application " | Freq: Once | CUTANEOUS | Status: AC
  Administered 2021-06-24: 1 via URETHRAL
  Filled 2021-06-23: qty 6

## 2021-06-23 MED ORDER — DEXMEDETOMIDINE HCL IN NACL 400 MCG/100ML IV SOLN
0.4000 ug/kg/h | INTRAVENOUS | Status: DC
  Administered 2021-06-23 – 2021-06-24 (×2): 0.4 ug/kg/h via INTRAVENOUS
  Filled 2021-06-23 (×2): qty 100

## 2021-06-23 MED ORDER — ESCITALOPRAM OXALATE 10 MG PO TABS
10.0000 mg | ORAL_TABLET | Freq: Every day | ORAL | Status: DC
  Administered 2021-06-23 – 2021-06-24 (×2): 10 mg via ORAL
  Filled 2021-06-23 (×2): qty 1

## 2021-06-23 NOTE — Progress Notes (Signed)
Subjective: ?The patient is alert and pleasant.  He says "I have to go home". ? ?Objective: ?Vital signs in last 24 hours: ?Temp:  [97.6 ?F (36.4 ?C)-98.6 ?F (37 ?C)] 98.5 ?F (36.9 ?C) (05/11 0400) ?Pulse Rate:  [62-123] 88 (05/11 0600) ?Resp:  [13-32] 25 (05/11 0600) ?BP: (75-156)/(61-92) 129/70 (05/11 0600) ?SpO2:  [79 %-99 %] 98 % (05/11 0600) ?Arterial Line BP: (86-163)/(61-148) 112/83 (05/11 0600) ?Weight:  [70.1 kg] 70.1 kg (05/11 0500) ?Estimated body mass index is 22.17 kg/m? as calculated from the following: ?  Height as of this encounter: '5\' 10"'$  (1.778 m). ?  Weight as of this encounter: 70.1 kg. ? ? ?Intake/Output from previous day: ?05/10 0701 - 05/11 0700 ?In: 2808.2 [I.V.:2185.4; IV Piggyback:572.8] ?Out: 1950 [Urine:1650; Blood:300] ?Intake/Output this shift: ?No intake/output data recorded. ? ?Physical exam the patient is alert and pleasant.  He is moving all 4 extremities.  His pupils are equal.  He is much more alert and his speech is better than preop.  His dressing is clean and dry. ? ?Lab Results: ?Recent Labs  ?  06/20/21 ?1021 06/22/21 ?1543 06/23/21 ?0150  ?WBC 8.3  --  17.3*  ?HGB 14.4 13.3 12.6*  ?HCT 42.8 39.0 36.9*  ?PLT 298  --  259  ? ?BMET ?Recent Labs  ?  06/20/21 ?1021 06/22/21 ?1543 06/23/21 ?0150  ?NA 137 140 141  ?K 4.1 4.1 4.0  ?CL 104  --  113*  ?CO2 26  --  23  ?GLUCOSE 112*  --  127*  ?BUN 15  --  33*  ?CREATININE 0.72  --  1.12  ?CALCIUM 8.9  --  8.4*  ? ? ?Studies/Results: ?MR BRAIN W WO CONTRAST ? ?Result Date: 06/22/2021 ?CLINICAL DATA:  Brain/CNS neoplasm, monitor EXAM: MRI HEAD WITHOUT AND WITH CONTRAST TECHNIQUE: Multiplanar, multiecho pulse sequences of the brain and surrounding structures were obtained without and with intravenous contrast. CONTRAST:  35m GADAVIST GADOBUTROL 1 MMOL/ML IV SOLN COMPARISON:  MRI head Jun 20, 2021. FINDINGS: Brain: In comparison to recent MRI head, no substantial change in a large, heterogeneously enhancing mass involving the left temporal  and frontal lobes and insula. Similar cortical thickening and adjacent satellite nodule, detailed on the prior. Similar moderate surrounding T2/stir hyperintense signal. Similar mass effect on the left basal ganglia, left lateral ventricle and third ventricle with similar approximately 1.2 cm of rightward midline shift. Similar mass effect on the midbrain. Mild dilation of the right lateral ventricle is unchanged. No evidence of acute infarct. Vascular: Major intracranial vessels are maintained at the skull base. Displacement left MCA vessels by the mass. Skull and upper cervical spine: Normal marrow signal. Sinuses/Orbits: Clear sinuses.  No acute orbital findings. Other: Small bilateral mastoid effusions. 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 Similar regional mass effect and edema with 1.2 cm of rightward midline shift. Similar mild dilation the right lateral ventricle, which may represent early ventricular entrapment. Electronically Signed   By: FMargaretha SheffieldM.D.   On: 06/22/2021 14:31   ? ?Assessment/Plan: ?Postop day #1: The patient is doing better.  I do not think he will lie still for a operative MRI.  Hopefully he will continue to slowly improve. ? LOS: 3 days  ? ? ? ?JOphelia Charter?06/23/2021, 7:52 AM ? ? ? ? ?Patient ID: SIwao [name] male   DOB: 915-Nov-1962 60y.o.   MRN: [id number]? ?

## 2021-06-23 NOTE — Progress Notes (Signed)
I met with the patient's wife and daughter at the patient's bedside.  I updated them on the suspected diagnosis, i.e. a glioblastoma.  We discussed the treatment options including further treatment with chemotherapy radiation, etc.  I have answered all their questions.  We will likely send the patient home this weekend and follow-up with neuro-oncology as an outpatient. ?

## 2021-06-23 NOTE — Evaluation (Signed)
Physical Therapy Evaluation ?Patient Details ?Name: Todd [name] ?MRN: [id number] ?DOB: 1960-12-06 ?Today's Date: 06/23/2021 ? ?History of Present Illness ? 61 y.o. male presents to [facility] hospital on 06/20/2021 with weakness, confusion, fall. 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 Pt underwent Left frontal temporal parietal craniotomy for resection of left temporal brain tumor on 06/22/2021. PMH includes anxiety, CHF, depression.  ?Clinical Impression ? Pt presents to PT with deficits in cognition, communication, gait, balance, endurance, strength. Pt is confused, disoriented to place and situation. Pt with instability in standing and reduced awareness of safety at this time, although often reaching for UE support of IV pole or bed rails in an effort to improve stability. PT calls pt's spouse, she reports it is the family goal to discharge home with 24/7 assistance from herself. Pt will benefit from aggressive mobilization in an effort to reduce falls risk. Pt will benefit from gait assessment with DME tomorrow.  ?   ? ?Recommendations for follow up therapy are one component of a multi-disciplinary discharge planning process, led by the attending physician.  Recommendations may be updated based on patient status, additional functional criteria and insurance authorization. ? ?Follow Up Recommendations Home health PT ? ?  ?Assistance Recommended at Discharge Frequent or constant Supervision/Assistance  ?Patient can return home with the following ? A lot of help with walking and/or transfers;A lot of help with bathing/dressing/bathroom;Assistance with cooking/housework;Assistance with feeding;Direct supervision/assist for medications management;Direct supervision/assist for financial management;Assist for transportation;Help with stairs or ramp for entrance ? ?  ?Equipment Recommendations Rolling walker (2 wheels);BSC/3in1  ?Recommendations for Other  Services ?    ?  ?Functional Status Assessment Patient has had a recent decline in their functional status and demonstrates the ability to make significant improvements in function in a reasonable and predictable amount of time.  ? ?  ?Precautions / Restrictions Precautions ?Precautions: Fall ?Restrictions ?Weight Bearing Restrictions: No  ? ?  ? ?Mobility ? Bed Mobility ?Overal bed mobility: Needs Assistance ?Bed Mobility: Supine to Sit, Sit to Supine ?  ?  ?Supine to sit: Min guard ?Sit to supine: Min guard ?  ?General bed mobility comments: assist for safety ?  ? ?Transfers ?Overall transfer level: Needs assistance ?Equipment used: 1 person hand held assist ?Transfers: Sit to/from Stand ?Sit to Stand: Min assist ?  ?  ?  ?  ?  ?  ?  ? ?Ambulation/Gait ?Ambulation/Gait assistance: Min assist ?Gait Distance (Feet): 50 Feet ?Assistive device: 1 person hand held assist, IV Pole ?Gait Pattern/deviations: Step-to pattern, Drifts right/left ?Gait velocity: reduced ?Gait velocity interpretation: <1.31 ft/sec, indicative of household ambulator ?  ?General Gait Details: pt with slowed step-to gait, increased lateral sway, reaching for UE support during periods without UE support ? ?Stairs ?  ?  ?  ?  ?  ? ?Wheelchair Mobility ?  ? ?Modified Rankin (Stroke Patients Only) ?  ? ?  ? ?Balance Overall balance assessment: Needs assistance ?Sitting-balance support: No upper extremity supported, Feet supported ?Sitting balance-Leahy Scale: Fair ?  ?  ?Standing balance support: Single extremity supported, Reliant on assistive device for balance ?Standing balance-Leahy Scale: Poor ?Standing balance comment: pt searching for UE support when standing or ambulating ?  ?  ?  ?  ?  ?  ?  ?  ?  ?  ?  ?   ? ? ? ?Pertinent Vitals/Pain Pain Assessment ?Pain Assessment: Faces ?Faces Pain Scale: Hurts little more ?Pain Location: head ?  Pain Descriptors / Indicators: Headache ?Pain Intervention(s): Monitored during session  ? ? ?Home Living  Family/patient expects to be discharged to:: Private residence ?Living Arrangements: Spouse/significant other ?Available Help at Discharge: Family;Available 24 hours/day ?Type of Home: House ?Home Access: Stairs to enter ?Entrance Stairs-Rails: Right ?Entrance Stairs-Number of Steps: 5 ?  ?Home Layout: One level ?Home Equipment: Crutches ?   ?  ?Prior Function Prior Level of Function : Independent/Modified Independent;Driving ?  ?  ?  ?  ?  ?  ?  ?  ?  ? ? ?Hand Dominance  ?   ? ?  ?Extremity/Trunk Assessment  ? Upper Extremity Assessment ?Upper Extremity Assessment: Overall WFL for tasks assessed ?  ? ?Lower Extremity Assessment ?Lower Extremity Assessment: Generalized weakness ?  ? ?Cervical / Trunk Assessment ?Cervical / Trunk Assessment: Normal  ?Communication  ? Communication: Expressive difficulties  ?Cognition Arousal/Alertness: Awake/alert ?Behavior During Therapy: Impulsive ?Overall Cognitive Status: Impaired/Different from baseline ?Area of Impairment: Orientation, Attention, Memory, Following commands, Safety/judgement, Awareness, Problem solving ?  ?  ?  ?  ?  ?  ?  ?  ?Orientation Level: Disoriented to, Place, Time, Situation ?Current Attention Level: Sustained ?Memory: Decreased recall of precautions, Decreased short-term memory ?Following Commands: Follows one step commands with increased time ?Safety/Judgement: Decreased awareness of safety, Decreased awareness of deficits ?Awareness: Intellectual ?Problem Solving: Slow processing, Requires verbal cues, Requires tactile cues ?  ?  ?  ? ?  ?General Comments General comments (skin integrity, edema, etc.): VSS on RA ? ?  ?Exercises    ? ?Assessment/Plan  ?  ?PT Assessment Patient needs continued PT services  ?PT Problem List Decreased strength;Decreased activity tolerance;Decreased balance;Decreased mobility;Decreased cognition;Decreased knowledge of use of DME;Decreased safety awareness;Decreased knowledge of precautions ? ?   ?  ?PT Treatment  Interventions DME instruction;Gait training;Functional mobility training;Stair training;Therapeutic activities;Therapeutic exercise;Balance training;Neuromuscular re-education;Patient/family education   ? ?PT Goals (Current goals can be found in the Care Plan section)  ?Acute Rehab PT Goals ?Patient Stated Goal: to return home ?PT Goal Formulation: With family ?Time For Goal Achievement: 07/07/21 ?Potential to Achieve Goals: Fair ? ?  ?Frequency Min 3X/week ?  ? ? ?Co-evaluation   ?  ?  ?  ?  ? ? ?  ?AM-PAC PT "6 Clicks" Mobility  ?Outcome Measure Help needed turning from your back to your side while in a flat bed without using bedrails?: A Little ?Help needed moving from lying on your back to sitting on the side of a flat bed without using bedrails?: A Little ?Help needed moving to and from a bed to a chair (including a wheelchair)?: A Little ?Help needed standing up from a chair using your arms (e.g., wheelchair or bedside chair)?: A Little ?Help needed to walk in hospital room?: A Little ?Help needed climbing 3-5 steps with a railing? : Total ?6 Click Score: 16 ? ?  ?End of Session   ?Activity Tolerance: Patient tolerated treatment well ?Patient left: in bed;with call bell/phone within reach;with bed alarm set;with restraints reapplied ?Nurse Communication: Mobility status ?PT Visit Diagnosis: Other abnormalities of gait and mobility (R26.89);Other symptoms and signs involving the nervous system (R29.898) ?  ? ?Time: 9485-4627 ?PT Time Calculation (min) (ACUTE ONLY): 27 min ? ? ?Charges:   PT Evaluation ?$PT Eval Low Complexity: 1 Low ?  ?  ?   ? ? ?Zenaida Niece, PT, DPT ?Acute Rehabilitation ?Pager: [phone] ?Office [phone] ? ? ?Zenaida Niece ?06/23/2021, 5:35 PM ? ?

## 2021-06-23 NOTE — Plan of Care (Signed)
?  Problem: Safety: ?Goal: Non-violent Restraint(s) ?06/23/2021 0225 by Sherre Scarlet, RN ?Outcome: Progressing ?06/23/2021 0222 by Sherre Scarlet, RN ?Outcome: Not Progressing ?  ?Problem: Clinical Measurements: ?Goal: Ability to maintain clinical measurements within normal limits will improve ?06/23/2021 0225 by Sherre Scarlet, RN ?Outcome: Progressing ?06/23/2021 0222 by Sherre Scarlet, RN ?Outcome: Progressing ?Goal: Will remain free from infection ?06/23/2021 0225 by Sherre Scarlet, RN ?Outcome: Progressing ?06/23/2021 0222 by Sherre Scarlet, RN ?Outcome: Progressing ?Goal: Diagnostic test results will improve ?06/23/2021 0225 by Sherre Scarlet, RN ?Outcome: Progressing ?06/23/2021 0222 by Sherre Scarlet, RN ?Outcome: Progressing ?Goal: Respiratory complications will improve ?06/23/2021 0225 by Sherre Scarlet, RN ?Outcome: Progressing ?06/23/2021 0222 by Sherre Scarlet, RN ?Outcome: Progressing ?Goal: Cardiovascular complication will be avoided ?Outcome: Progressing ?  ?Problem: Activity: ?Goal: Risk for activity intolerance will decrease ?Outcome: Progressing ?  ?Problem: Nutrition: ?Goal: Adequate nutrition will be maintained ?Outcome: Progressing ?  ?Problem: Safety: ?Goal: Ability to remain free from injury will improve ?Outcome: Progressing ?  ?Problem: Skin Integrity: ?Goal: Risk for impaired skin integrity will decrease ?Outcome: Progressing ?  ?

## 2021-06-24 ENCOUNTER — Encounter (HOSPITAL_COMMUNITY): Payer: Self-pay | Admitting: Certified Registered Nurse Anesthetist

## 2021-06-24 MED ORDER — PANTOPRAZOLE SODIUM 40 MG PO TBEC
40.0000 mg | DELAYED_RELEASE_TABLET | Freq: Every day | ORAL | Status: DC
Start: 2021-06-24 — End: 2021-06-24

## 2021-06-24 MED ORDER — TAMSULOSIN HCL 0.4 MG PO CAPS
0.4000 mg | ORAL_CAPSULE | Freq: Every day | ORAL | Status: DC
  Administered 2021-06-24: 0.4 mg via ORAL
  Filled 2021-06-24: qty 1

## 2021-06-24 MED ORDER — LEVETIRACETAM 500 MG PO TABS
500.0000 mg | ORAL_TABLET | Freq: Two times a day (BID) | ORAL | Status: DC
Start: 2021-06-24 — End: 2021-06-24

## 2021-06-24 MED ORDER — DEXAMETHASONE 4 MG PO TABS
4.0000 mg | ORAL_TABLET | Freq: Three times a day (TID) | ORAL | Status: DC
  Filled 2021-06-24: qty 1

## 2021-06-24 MED ORDER — HYDROCODONE-ACETAMINOPHEN 5-325 MG PO TABS: 1.0000 | ORAL_TABLET | ORAL | 0 refills | Status: DC | PRN

## 2021-06-24 MED ORDER — TAMSULOSIN HCL 0.4 MG PO CAPS: 0.4000 mg | ORAL_CAPSULE | Freq: Every day | ORAL | 0 refills | Status: AC

## 2021-06-24 MED ORDER — LEVETIRACETAM 500 MG PO TABS: 500.0000 mg | ORAL_TABLET | Freq: Two times a day (BID) | ORAL | 0 refills | Status: DC

## 2021-06-24 MED ORDER — DEXAMETHASONE 4 MG PO TABS: 4.0000 mg | ORAL_TABLET | Freq: Three times a day (TID) | ORAL | 0 refills | Status: DC

## 2021-06-24 NOTE — Discharge Summary (Signed)
Physician Discharge Summary  ?Patient ID: ?Todd [name] ?MRN: [id number] ?DOB/AGE: February 16, 1960 61 y.o. ? ?Admit date: 06/20/2021 ?Discharge date: 06/24/2021 ? ?Admission Diagnoses:Left temporal brain tumor ? ?Discharge Diagnoses:  the same ?Principal Problem: ?  Brain tumor ([place]) ?Active Problems: ?  Essential hypertension ?  CHF (congestive heart failure) ([place]) ?  Dizziness ? ? ?Discharged Condition: fair ? ?Hospital Course:  the patient was admitted on 06/20/2021 with a diagnosis of a left brain tumor.  The patient was quite confused/ obtunded so I discussed situation with the patient's wife.  We discussed the various treatment options including doing nothing, empiric treatment, biopsy, and surgery.  The patient's wife decided to proceed with surgery.   ? ?I performed a left frontal temporal parietal craniotomy for resection /debulking of his brain tumor on 06/22/2021.  The surgery went well.   ? ?The patient's postoperative course was remarkable for some urinary retention.  He had a Foley catheter placed.  By his wife's report he has had trouble with this and has had I and O caths as an outpatient in the past. ? ?The patient's mental status, speech, confusion, etcetera improved.  On 06/24/2021 the patient, and his wife, requested that he be discharged to home.  Arrangements will be made for follow-up in my office in about 10 days to have his staples removed and to follow-up with Dr. Mickeal Skinner, Neuro-Oncology.  At the time of this dictation his pathology is pending.  They were given discharge instructions and their questions were answered.  Prescriptions for hydrocodone, Flomax, Decadron and Keppra were sent to his pharmacy. ? ?Consults:  PT, OT ?Significant Diagnostic Studies: head CT, brain MRIs ?Treatments: left frontal temporal parietal craniotomy for resection of brain tumor using microdissection and BrainLAB neuro navigation. ?Discharge Exam: ?Blood pressure 122/75, pulse 75, temperature 98.2 ?F (36.8 ?C),  temperature source Oral, resp. rate 17, height '5\' 10"'$  (1.778 m), weight 70.1 kg, SpO2 96 %. ?  The patient is alert and pleasant.  His speech and confusion is much improved.  His wound is healing well.  His strength is normal. ? ?Disposition:   Home with his wife. ? ?Discharge Instructions   ? ? Call MD for:  difficulty breathing, headache or visual disturbances   Complete by: As directed ?  ? Call MD for:  extreme fatigue   Complete by: As directed ?  ? Call MD for:  hives   Complete by: As directed ?  ? Call MD for:  persistant dizziness or light-headedness   Complete by: As directed ?  ? Call MD for:  persistant nausea and vomiting   Complete by: As directed ?  ? Call MD for:  redness, tenderness, or signs of infection (pain, swelling, redness, odor or green/yellow discharge around incision site)   Complete by: As directed ?  ? Call MD for:  severe uncontrolled pain   Complete by: As directed ?  ? Call MD for:  temperature >100.4   Complete by: As directed ?  ? Diet - low sodium heart healthy   Complete by: As directed ?  ? Discharge instructions   Complete by: As directed ?  ? Call [phone] for a followup appointment. Take a stool softener while you are using pain medications.  ? Driving Restrictions   Complete by: As directed ?  ? Do not drive .  ? Increase activity slowly   Complete by: As directed ?  ? Lifting restrictions   Complete by: As directed ?  ? Do  not lift more than 5 pounds. No excessive bending or twisting.  ? May shower / Bathe   Complete by: As directed ?  ? Remove the dressing for 3 days after surgery.  You may shower, but leave the incision alone.  ? Remove dressing in 24 hours   Complete by: As directed ?  ? ?  ? ?Allergies as of 06/24/2021   ?No Known Allergies ?  ? ?  ?Medication List  ?  ? ?STOP taking these medications   ? ?aspirin EC 81 MG tablet ?  ? ?  ? ?TAKE these medications   ? ?COUGH & COLD PO ?Take 5 mLs by mouth every 6 (six) hours as needed (cough). ?  ?dexamethasone 4 MG  tablet ?Commonly known as: DECADRON ?Take 1 tablet (4 mg total) by mouth every 8 (eight) hours. ?  ?escitalopram 10 MG tablet ?Commonly known as: Lexapro ?Take 1 tablet (10 mg total) by mouth daily. ?  ?HYDROcodone-acetaminophen 5-325 MG tablet ?Commonly known as: NORCO/VICODIN ?Take 1 tablet by mouth every 4 (four) hours as needed for moderate pain. ?  ?levETIRAcetam 500 MG tablet ?Commonly known as: KEPPRA ?Take 1 tablet (500 mg total) by mouth 2 (two) times daily. ?  ?losartan 25 MG tablet ?Commonly known as: COZAAR ?Take 1 tablet (25 mg total) by mouth daily. ?  ?metoprolol succinate 50 MG 24 hr tablet ?Commonly known as: Toprol XL ?Take 1 tablet (50 mg total) by mouth daily. Take with or immediately following a meal. ?  ?tamsulosin 0.4 MG Caps capsule ?Commonly known as: FLOMAX ?Take 1 capsule (0.4 mg total) by mouth daily. ?Start taking on: Jun 25, 2021 ?  ?ZZZQUIL PO ?Take 30 mLs by mouth daily as needed (takes at bedtime for sleep). ?  ? ?  ? ? ? ?Signed: ?[name] ?06/24/2021, 2:21 PM ? ? ? ? ?

## 2021-06-24 NOTE — Progress Notes (Signed)
Nursing dc note ? ?Patients wife margaret alert and oriented verbalized understanding of dc instructions. All belongings given to patients wife. ?

## 2021-06-24 NOTE — Evaluation (Signed)
Occupational Therapy Evaluation ?Patient Details ?Name: Todd [name] Born ?MRN: [id number] ?DOB: 1961/01/22 ?Today's Date: 06/24/2021 ? ? ?History of Present Illness 61 y.o. male presents to [facility] hospital on 06/20/2021 with weakness, confusion, fall. 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 Pt underwent Left frontal temporal parietal craniotomy for resection of left temporal brain tumor on 06/22/2021. PMH includes anxiety, CHF, depression.  ? ?Clinical Impression ?  ?PTA pt lives with his wife independently. Pt currently requires assistance with all ADL and mobility due to deficits listed below. Able to ambulate with HHA using gait belt. Requires direct assistance with ADL tasks due to cognitive and balance deficits. Wife verbalized understanding for need of direct physical assistance. Pt perseverating on going home throughout session. Wife states she worked in a group home and feels comfortable helping her husband at this level. Strongly recommend follow up with [place] if available. ?   ? ?Recommendations for follow up therapy are one component of a multi-disciplinary discharge planning process, led by the attending physician.  Recommendations may be updated based on patient status, additional functional criteria and insurance authorization.  ? ?Follow Up Recommendations ? Home health OT (if not available, outpt OT)  ?  ?Assistance Recommended at Discharge Frequent or constant Supervision/Assistance  ?Patient can return home with the following A lot of help with walking and/or transfers;A lot of help with bathing/dressing/bathroom;Assistance with cooking/housework;Assistance with feeding;Direct supervision/assist for medications management;Direct supervision/assist for financial management;Assist for transportation;Help with stairs or ramp for entrance ? ?  ?Functional Status Assessment ? Patient has had a recent decline in their functional status and  demonstrates the ability to make significant improvements in function in a reasonable and predictable amount of time.  ?Equipment Recommendations ? BSC/3in1  ?  ?Recommendations for Other Services   ? ? ?  ?Precautions / Restrictions Precautions ?Precautions: Fall ?Restrictions ?Weight Bearing Restrictions: No  ? ?  ? ?Mobility Bed Mobility ?Overal bed mobility: Needs Assistance ?Bed Mobility: Supine to Sit, Sit to Supine ?  ?  ?Supine to sit: Supervision ?  ?  ?  ?  ? ?Transfers ?Overall transfer level: Needs assistance ?Equipment used: None ?Transfers: Sit to/from Stand ?Sit to Stand: Min guard ?  ?  ?  ?  ?  ?  ?  ? ?  ?Balance Overall balance assessment: Needs assistance ?Sitting-balance support: No upper extremity supported, Feet supported ?Sitting balance-Leahy Scale: Fair ?  ?  ?Standing balance support: No upper extremity supported, During functional activity ?Standing balance-Leahy Scale: Fair ?  ?  ?  ?  ?  ?  ?  ?  ?  ?  ?  ?  ?   ? ?ADL either performed or assessed with clinical judgement  ? ?ADL Overall ADL's : Needs assistance/impaired ?Eating/Feeding: Supervision/ safety ?  ?Grooming: Minimal assistance ?  ?Upper Body Bathing: Minimal assistance ?  ?Lower Body Bathing: Minimal assistance ?  ?Upper Body Dressing : Moderate assistance ?  ?Lower Body Dressing: Minimal assistance ?  ?Toilet Transfer: Minimal assistance ?  ?Toileting- Clothing Manipulation and Hygiene: Total assistance ?Toileting - Clothing Manipulation Details (indicate cue type and reason): usually self catheters ?  ?  ?Functional mobility during ADLs: Minimal assistance ?General ADL Comments: Does best wtih HHA; Assessed with RW however pt picking up the RW adn is not safe  ? ? ? ?Vision Baseline Vision/History: 1 Wears glasses ?Additional Comments: no over/undershooting noted; blurred vision at baseline; unable to attend to follow more  formal visual assessment  ?   ?Perception   ?  ?Praxis   ?  ? ?Pertinent Vitals/Pain Pain  Assessment ?Pain Assessment: Faces ?Faces Pain Scale: Hurts a little bit ?Pain Location: head ?Pain Descriptors / Indicators: Aching ?Pain Intervention(s): Limited activity within patient's tolerance  ? ? ? ?Hand Dominance Right ?  ?Extremity/Trunk Assessment Upper Extremity Assessment ?Upper Extremity Assessment: Generalized weakness (tremors noted; using functionally) ?  ?Lower Extremity Assessment ?Lower Extremity Assessment: Defer to PT evaluation ?  ?Cervical / Trunk Assessment ?Cervical / Trunk Assessment: Normal ?  ?Communication Communication ?Communication: Expressive difficulties ?  ?Cognition Arousal/Alertness: Awake/alert ?Behavior During Therapy: Impulsive ?Overall Cognitive Status: Impaired/Different from baseline ?Area of Impairment: Orientation, Memory, Following commands, Safety/judgement, Problem solving, Awareness ?  ?  ?  ?  ?  ?  ?  ?  ?Orientation Level: Disoriented to, Time, Situation, Place ?Current Attention Level: Sustained ?Memory: Decreased recall of precautions, Decreased short-term memory ?Following Commands: Follows one step commands with increased time, Follows multi-step commands inconsistently ?Safety/Judgement: Decreased awareness of safety, Decreased awareness of deficits ?Awareness: Intellectual ?Problem Solving: Slow processing, Requires verbal cues, Requires tactile cues ?  ?  ?  ?General Comments  VSS ? ?  ?Exercises   ?  ?Shoulder Instructions    ? ? ?Home Living Family/patient expects to be discharged to:: Private residence ?Living Arrangements: Spouse/significant other ?Available Help at Discharge: Family;Available 24 hours/day ?Type of Home: House ?Home Access: Stairs to enter ?Entrance Stairs-Number of Steps: 5 ?Entrance Stairs-Rails: Right ?Home Layout: One level ?  ?  ?Bathroom Shower/Tub: Walk-in shower ?  ?Bathroom Toilet: Standard ?Bathroom Accessibility: Yes ?How Accessible: Accessible via walker ?Home Equipment: Crutches ?  ?  ?  ? ?  ?Prior Functioning/Environment  Prior Level of Function : Independent/Modified Independent;Driving ?  ?  ?  ?  ?  ?  ?  ?  ?  ? ?  ?  ?OT Problem List: Decreased activity tolerance;Impaired balance (sitting and/or standing);Decreased coordination;Decreased cognition;Decreased safety awareness;Decreased knowledge of use of DME or AE;Decreased knowledge of precautions ?  ?   ?OT Treatment/Interventions: Self-care/ADL training;Therapeutic exercise;Neuromuscular education;DME and/or AE instruction;Therapeutic activities;Cognitive remediation/compensation;Visual/perceptual remediation/compensation;Patient/family education;Balance training  ?  ?OT Goals(Current goals can be found in the care plan section) Acute Rehab OT Goals ?Patient Stated Goal: to go home today at 1pm ?OT Goal Formulation: All assessment and education complete, DC therapy (all further OT in next venue)  ?OT Frequency: Min 2X/week ?  ? ?Co-evaluation   ?  ?  ?  ?  ? ?  ?AM-PAC OT "6 Clicks" Daily Activity     ?Outcome Measure Help from another person eating meals?: A Little ?Help from another person taking care of personal grooming?: A Little ?Help from another person toileting, which includes using toliet, bedpan, or urinal?: Total ?Help from another person bathing (including washing, rinsing, drying)?: A Little ?Help from another person to put on and taking off regular upper body clothing?: A Little ?Help from another person to put on and taking off regular lower body clothing?: A Little ?6 Click Score: 16 ?  ?End of Session Equipment Utilized During Treatment: Gait belt ?Nurse Communication: Mobility status;Other (comment) (DC needs) ? ?Activity Tolerance: Patient tolerated treatment well ?Patient left: in chair;with call bell/phone within reach;with restraints reapplied;with family/visitor present ? ?OT Visit Diagnosis: Unsteadiness on feet (R26.81);Other abnormalities of gait and mobility (R26.89);Other symptoms and signs involving cognitive function;Other symptoms and signs  involving the nervous system (R29.898);Dizziness and giddiness (R42)  ?              ?  Time: 8882-8003 ?OT Time Calculation (min): 36 min ?Charges:  OT General Charges ?$OT Visit: 1 Visit ?OT Evaluation ?$OT Eval Moderate

## 2021-06-24 NOTE — Plan of Care (Signed)
Patient ambulated in hallway and answers orientation questions appropriately. Incision is clean, dry, and intact ?

## 2021-06-24 NOTE — Progress Notes (Signed)
Physical Therapy Treatment ?Patient Details ?Name: Todd [name] ?MRN: [id number] ?DOB: 1960-05-06 ?Today's Date: 06/24/2021 ? ? ?History of Present Illness 61 y.o. male presents to [facility] hospital on 06/20/2021 with weakness, confusion, fall. 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 Pt underwent Left frontal temporal parietal craniotomy for resection of left temporal brain tumor on 06/22/2021. PMH includes anxiety, CHF, depression. ? ?  ?PT Comments  ? ? Pt tolerates treatment well, demonstrating improved stability during session. Pt progresses to stair negotiation with assistance for safety. Pt's spouse present and reports feeling comfortable with taking pt home. Pt will benefit from frequent supervision as his cognition and awareness remain impaired. Pt will benefit from continued PT services in an effort to improve dynamic balance and reduce falls risk.   ?Recommendations for follow up therapy are one component of a multi-disciplinary discharge planning process, led by the attending physician.  Recommendations may be updated based on patient status, additional functional criteria and insurance authorization. ? ?Follow Up Recommendations ? Home health PT (outpatient if no charity HHPT available) ?  ?  ?Assistance Recommended at Discharge Frequent or constant Supervision/Assistance  ?Patient can return home with the following A little help with walking and/or transfers;A lot of help with bathing/dressing/bathroom;Assistance with cooking/housework;Direct supervision/assist for medications management;Direct supervision/assist for financial management;Assist for transportation;Help with stairs or ramp for entrance ?  ?Equipment Recommendations ? BSC/3in1  ?  ?Recommendations for Other Services   ? ? ?  ?Precautions / Restrictions Precautions ?Precautions: Fall ?Restrictions ?Weight Bearing Restrictions: No  ?  ? ?Mobility ? Bed Mobility ?  ?  ?  ?  ?  ?  ?   ?  ?  ? ?Transfers ?Overall transfer level: Needs assistance ?Equipment used: None ?Transfers: Sit to/from Stand ?Sit to Stand: Min guard ?  ?  ?  ?  ?  ?  ?  ? ?Ambulation/Gait ?Ambulation/Gait assistance: Min guard ?Gait Distance (Feet): 150 Feet ?Assistive device: 1 person hand held assist, IV Pole ?Gait Pattern/deviations: Step-through pattern ?Gait velocity: reduced ?Gait velocity interpretation: <1.8 ft/sec, indicate of risk for recurrent falls ?  ?General Gait Details: pt with slowed step-through gait, no sinderable balance deviations noted. Difficult to assess dual-task gait with impaired command following ? ? ?Stairs ?Stairs: Yes ?Stairs assistance: Min guard ?Stair Management: One rail Right, Step to pattern, Forwards ?Number of Stairs: 5 ?  ? ? ?Wheelchair Mobility ?  ? ?Modified Rankin (Stroke Patients Only) ?  ? ? ?  ?Balance Overall balance assessment: Needs assistance ?Sitting-balance support: No upper extremity supported, Feet supported ?Sitting balance-Leahy Scale: Fair ?  ?  ?Standing balance support: No upper extremity supported, During functional activity ?Standing balance-Leahy Scale: Fair ?  ?  ?  ?  ?  ?  ?  ?  ?  ?  ?  ?  ?  ? ?  ?Cognition Arousal/Alertness: Awake/alert ?Behavior During Therapy: Impulsive ?Overall Cognitive Status: Impaired/Different from baseline ?Area of Impairment: Orientation, Memory, Following commands, Safety/judgement, Problem solving, Awareness ?  ?  ?  ?  ?  ?  ?  ?  ?Orientation Level: Disoriented to, Time, Situation, Place ?Current Attention Level: Sustained ?Memory: Decreased recall of precautions, Decreased short-term memory ?Following Commands: Follows one step commands with increased time, Follows multi-step commands inconsistently ?Safety/Judgement: Decreased awareness of safety, Decreased awareness of deficits ?Awareness: Intellectual ?Problem Solving: Slow processing, Requires verbal cues, Requires tactile cues ?  ?  ?  ? ?  ?Exercises   ? ?  ?  General  Comments General comments (skin integrity, edema, etc.): VSS on RA ?  ?  ? ?Pertinent Vitals/Pain Pain Assessment ?Pain Assessment: Faces ?Faces Pain Scale: Hurts a little bit ?Pain Location: head ?Pain Descriptors / Indicators: Aching ?Pain Intervention(s): Monitored during session  ? ? ?Home Living   ?  ?  ?  ?  ?  ?  ?  ?  ?  ?   ?  ?Prior Function    ?  ?  ?   ? ?PT Goals (current goals can now be found in the care plan section) Acute Rehab PT Goals ?Patient Stated Goal: to return home ?Progress towards PT goals: Progressing toward goals ? ?  ?Frequency ? ? ? Min 3X/week ? ? ? ?  ?PT Plan Current plan remains appropriate  ? ? ?Co-evaluation   ?  ?  ?  ?  ? ?  ?AM-PAC PT "6 Clicks" Mobility   ?Outcome Measure ? Help needed turning from your back to your side while in a flat bed without using bedrails?: A Little ?Help needed moving from lying on your back to sitting on the side of a flat bed without using bedrails?: A Little ?Help needed moving to and from a bed to a chair (including a wheelchair)?: A Little ?Help needed standing up from a chair using your arms (e.g., wheelchair or bedside chair)?: A Little ?Help needed to walk in hospital room?: A Little ?Help needed climbing 3-5 steps with a railing? : A Little ?6 Click Score: 18 ? ?  ?End of Session   ?Activity Tolerance: Patient tolerated treatment well ?Patient left: in chair;with call bell/phone within reach;with chair alarm set;with family/visitor present;with restraints reapplied ?Nurse Communication: Mobility status ?PT Visit Diagnosis: Other abnormalities of gait and mobility (R26.89);Other symptoms and signs involving the nervous system (R29.898) ?  ? ? ?Time: 2482-5003 ?PT Time Calculation (min) (ACUTE ONLY): 19 min ? ?Charges:  $Gait Training: 8-22 mins          ?          ? ?Zenaida Niece, PT, DPT ?Acute Rehabilitation ?Pager: [phone] ?Office [phone] ? ? ? ?Zenaida Niece ?06/24/2021, 1:36 PM ? ?

## 2021-06-24 NOTE — Progress Notes (Signed)
Subjective: ?The patient is much more alert and speaking better.  He wants to go home.  He had urinary retention last night requiring a Foley catheter to be placed. ? ?Objective: ?Vital signs in last 24 hours: ?Temp:  [97.6 ?F (36.4 ?C)-98.4 ?F (36.9 ?C)] 97.8 ?F (36.6 ?C) (05/12 0400) ?Pulse Rate:  [55-110] 59 (05/12 0600) ?Resp:  [13-24] 17 (05/12 0600) ?BP: (99-153)/(62-109) 141/80 (05/12 0600) ?SpO2:  [90 %-97 %] 95 % (05/12 0600) ?Estimated body mass index is 22.17 kg/m? as calculated from the following: ?  Height as of this encounter: '5\' 10"'$  (1.778 m). ?  Weight as of this encounter: 70.1 kg. ? ? ?Intake/Output from previous day: ?05/11 0701 - 05/12 0700 ?In: 2035.5 [I.V.:1835.6; IV Piggyback:199.8] ?Out: 2850 [Urine:2850] ?Intake/Output this shift: ?No intake/output data recorded. ? ?Physical exam the patient is alert and pleasant.  His speech is much better.  He is moving all 4 extremities well.  He is still a bit confused but improving. ? ?Lab Results: ?Recent Labs  ?  06/22/21 ?1543 06/23/21 ?0150  ?WBC  --  17.3*  ?HGB 13.3 12.6*  ?HCT 39.0 36.9*  ?PLT  --  259  ? ?BMET ?Recent Labs  ?  06/22/21 ?1543 06/23/21 ?0150  ?NA 140 141  ?K 4.1 4.0  ?CL  --  113*  ?CO2  --  23  ?GLUCOSE  --  127*  ?BUN  --  33*  ?CREATININE  --  1.12  ?CALCIUM  --  8.4*  ? ? ?Studies/Results: ?MR BRAIN W WO CONTRAST ? ?Result Date: 06/22/2021 ?CLINICAL DATA:  Brain/CNS neoplasm, monitor EXAM: MRI HEAD WITHOUT AND WITH CONTRAST TECHNIQUE: Multiplanar, multiecho pulse sequences of the brain and surrounding structures were obtained without and with intravenous contrast. CONTRAST:  25m GADAVIST GADOBUTROL 1 MMOL/ML IV SOLN COMPARISON:  MRI head Jun 20, 2021. FINDINGS: Brain: In comparison to recent MRI head, no substantial change in a large, heterogeneously enhancing mass involving the left temporal and frontal lobes and insula. Similar cortical thickening and adjacent satellite nodule, detailed on the prior. Similar moderate  surrounding T2/stir hyperintense signal. Similar mass effect on the left basal ganglia, left lateral ventricle and third ventricle with similar approximately 1.2 cm of rightward midline shift. Similar mass effect on the midbrain. Mild dilation of the right lateral ventricle is unchanged. No evidence of acute infarct. Vascular: Major intracranial vessels are maintained at the skull base. Displacement left MCA vessels by the mass. Skull and upper cervical spine: Normal marrow signal. Sinuses/Orbits: Clear sinuses.  No acute orbital findings. Other: Small bilateral mastoid effusions. 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 Similar regional mass effect and edema with 1.2 cm of rightward midline shift. Similar mild dilation the right lateral ventricle, which may represent early ventricular entrapment. Electronically Signed   By: FMargaretha SheffieldM.D.   On: 06/22/2021 14:31   ? ?Assessment/Plan: ?Postop day #2: The patient is doing well.  He will likely go home this weekend with outpatient follow-up with neuro oncology. ? ?Urinary retention: Hopefully we can discontinue his catheter tomorrow. ? LOS: 4 days  ? ? ? ?JOphelia Charter?06/24/2021, 7:03 AM ? ? ? ? ?Patient ID: Todd [name] male   DOB: 905/30/1962 61y.o.   MRN: [id number]? ?

## 2021-06-24 NOTE — TOC Transition Note (Signed)
Transition of Care (TOC) - CM/SW Discharge Note ? ? ?Patient Details  ?Name: Todd [name] ?MRN: [id number] ?Date of Birth: [birth year] ? ?Transition of Care [facility]) CM/SW Contact:  ?Ella Bodo, RN ?Phone Number: ?06/24/2021, 2:47 PM ? ? ?Clinical Narrative:    ?61 y.o. male presents to [facility] hospital on 06/20/2021 with weakness, confusion, fall. 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 Pt underwent Left frontal temporal parietal craniotomy for resection of left temporal brain tumor on 06/22/2021. ?PTA, pt required assistance with ADLs; lives with spouse, who can provide needed assistance.  PT/OT recommending HH follow up.  He is uninsured; attempted to obtain charity [employer] services for patient, but they do not go to [place] where patient lives.  Patient/wife agreeable to OP referral for therapy; she prefers [facility] for f/u.  3 in 1 recommended and pt/family agreeable, but would like shipped to the home.  Referral to [facility] for 3 in 1 to be delivered to patient's residence.  ? ? ?Final next level of care: OP Rehab ?Barriers to Discharge: Barriers Resolved ? ? ?Patient Goals and CMS Choice ?Patient states their goals for this hospitalization and ongoing recovery are:: to go home ?  ?  ? ?  ?           ?  ?  ?  ?  ? ?Discharge Plan and Services ?  ?Discharge Planning Services: CM Consult ?           ?DME Arranged: 3-N-1 ?DME Agency: [employer] ?Date DME Agency Contacted: 06/24/21 ?Time DME Agency Contacted: [phone] ?Representative spoke with at DME Agency: Pura Spice ?  ?  ?  ?  ?  ? ?Social Determinants of Health (SDOH) Interventions ?  ? ? ?Readmission Risk Interventions ?   ? View : No data to display.  ?  ?  ?  ? ? ?Reinaldo Raddle, RN, BSN  ?Trauma/Neuro ICU Case Manager ?[phone] ? ? ? ?

## 2021-06-26 LAB — TYPE AND SCREEN
ABO/RH(D): A POS
Antibody Screen: NEGATIVE
Unit division: 0
Unit division: 0

## 2021-06-26 LAB — BPAM RBC
Blood Product Expiration Date: 202306072359
Blood Product Expiration Date: 202306072359
Unit Type and Rh: 6200
Unit Type and Rh: 6200

## 2021-06-27 ENCOUNTER — Telehealth: Payer: Self-pay | Admitting: *Deleted

## 2021-06-27 ENCOUNTER — Telehealth: Payer: Self-pay

## 2021-06-27 ENCOUNTER — Other Ambulatory Visit: Payer: Self-pay | Admitting: *Deleted

## 2021-06-27 NOTE — Telephone Encounter (Signed)
Transition Care Management Unsuccessful Follow-up Telephone Call ? ?Date of discharge and from where:  06/24/21 ? ?Attempts:  1st Attempt ? ?Reason for unsuccessful TCM follow-up call:  Left voice message ? ?  ?

## 2021-06-27 NOTE — Telephone Encounter (Signed)
Transition Care Management Unsuccessful Follow-up Telephone Call ? ?Date of discharge and from where:  06-24-21 Todd [name]  ? ?Attempts:  2nd Attempt ? ?Reason for unsuccessful TCM follow-up call:  Left voice message ? ? ? ?

## 2021-06-27 NOTE — Telephone Encounter (Signed)
Transition Care Management Unsuccessful Follow-up Telephone Call ? ?Date of discharge and from where:  06-24-21 Todd [name]  ? ?Attempts:  3rd Attempt ? ?Reason for unsuccessful TCM follow-up call:  Left voice message ? ? ? ?

## 2021-06-29 ENCOUNTER — Telehealth: Payer: Self-pay | Admitting: Internal Medicine

## 2021-06-29 NOTE — Telephone Encounter (Signed)
Called back with patient in regard to [facility]. ?Appt has been scheduled and patient aware. ? ?Can call Spouses phone to speak with patient, patient does not always answer phone. ?

## 2021-06-29 NOTE — Telephone Encounter (Signed)
Too late to do TOC call this can be a TOC as there are 3 documented attempts to reach pt please let them know just to keep TOC appt  ?

## 2021-07-07 ENCOUNTER — Ambulatory Visit (INDEPENDENT_AMBULATORY_CARE_PROVIDER_SITE_OTHER): Payer: Self-pay | Admitting: Family Medicine

## 2021-07-07 ENCOUNTER — Encounter: Payer: Self-pay | Admitting: Family Medicine

## 2021-07-07 VITALS — BP 116/62 | HR 100 | Ht 70.0 in | Wt 151.6 lb

## 2021-07-07 DIAGNOSIS — F321 Major depressive disorder, single episode, moderate: Secondary | ICD-10-CM

## 2021-07-07 DIAGNOSIS — I509 Heart failure, unspecified: Secondary | ICD-10-CM

## 2021-07-07 DIAGNOSIS — I10 Essential (primary) hypertension: Secondary | ICD-10-CM

## 2021-07-07 DIAGNOSIS — D332 Benign neoplasm of brain, unspecified: Secondary | ICD-10-CM

## 2021-07-07 MED ORDER — METOPROLOL SUCCINATE ER 50 MG PO TB24: 50.0000 mg | ORAL_TABLET | Freq: Every day | ORAL | 1 refills | Status: AC

## 2021-07-07 MED ORDER — LOSARTAN POTASSIUM 25 MG PO TABS: 25.0000 mg | ORAL_TABLET | Freq: Every day | ORAL | 1 refills | Status: AC

## 2021-07-07 MED ORDER — ESCITALOPRAM OXALATE 10 MG PO TABS: 10.0000 mg | ORAL_TABLET | Freq: Every day | ORAL | 3 refills | Status: DC

## 2021-07-07 NOTE — Patient Instructions (Signed)
I appreciate the opportunity to provide care to you today!   Please pick up your medication at the pharmacy   Please continue to a heart-healthy diet and increase your physical activities. Try to exercise for 30mins at least three times a week.      It was a pleasure to see you and I look forward to continuing to work together on your health and well-being. Please do not hesitate to call the office if you need care or have questions about your care.   Have a wonderful day and week. With Gratitude, Nelwyn Hebdon MSN, FNP-BC  

## 2021-07-07 NOTE — Progress Notes (Addendum)
Established Patient Office Visit  Subjective:  Patient ID: Todd [name], male    DOB: 10/13/1960  Age: 61 y.o. MRN: [id number]  CC:  Chief Complaint  Patient presents with   Follow-up    Pt is following up from ED visit on 06/20/2021, needs a referral faxed to occupational and physical therapy as they require Primary care to fax this rather than hospital.     HPI Todd [name] is a 61 y.o. male with past medical history of CHF, and essential hypertension presents for hospital follow-up s/p left frontal temporal parietal craniotomy. He was admitted on 06/20/21 with a diagnosis of a left brain tumor and had surgery on 06/22/21. His postoperative course was remarkable, with some urinary retention, and the patient was discharged home on dexamethasone with a prescription for hydrocodone, Flomax, Decadron, and Keppra sent to his pharmacy. He reports completing the dexamethasone. He was advised to follow up with Dr. Mickeal Skinner, Neuro-oncology, to remove his staples and a referral to OT and PT was placed. Since being discharged from the hospital, the patient has been doing well. He noted some bruising above and under his left eyes and arms, stating that the bruising has improved since its onset two days after his surgery. His wife indicated the patient is ninety percent back to his baseline and less confused. He dresses and shaves independently, knows when to sit down when tired, and is less depressed. He is mobile with no falls and uses the bathroom, emptying his Foley bag independently.  Past Medical History:  Diagnosis Date   Anxiety    CHF (congestive heart failure) ([place])    Depression     Past Surgical History:  Procedure Laterality Date   APPLICATION OF CRANIAL NAVIGATION Left 06/22/2021   Procedure: APPLICATION OF CRANIAL NAVIGATION;  Surgeon: Newman Pies, MD;  Location: [place];  Service: Neurosurgery;  Laterality: Left;   CRANIOTOMY Left 06/22/2021   Procedure: LEFT CRANIOTOMY FOR  TUMOR EXCISION;  Surgeon: Newman Pies, MD;  Location: [place];  Service: Neurosurgery;  Laterality: Left;   RADIOLOGY WITH ANESTHESIA N/A 06/22/2021   Procedure: MRI BRAIN  WITH ANESTHESIA;  Surgeon: Radiologist, Medication, MD;  Location: [place];  Service: Radiology;  Laterality: N/A;    Family History  Problem Relation Age of Onset   Cancer Mother    Dementia Father     Social History   Socioeconomic History   Marital status: Married    Spouse name: Not on file   Number of children: Not on file   Years of education: Not on file   Highest education level: Not on file  Occupational History   Not on file  Tobacco Use   Smoking status: Former   Smokeless tobacco: Never  Vaping Use   Vaping Use: Every day   Substances: Nicotine, Flavoring  Substance and Sexual Activity   Alcohol use: Never   Drug use: Never   Sexual activity: Yes    Birth control/protection: None  Other Topics Concern   Not on file  Social History Narrative   Not on file   Social Determinants of Health   Financial Resource Strain: Not on file  Food Insecurity: Not on file  Transportation Needs: Not on file  Physical Activity: Not on file  Stress: Not on file  Social Connections: Not on file  Intimate Partner Violence: Not on file    Outpatient Medications Prior to Visit  Medication Sig Dispense Refill   dexamethasone (DECADRON) 4 MG tablet Take 1 tablet (  4 mg total) by mouth every 8 (eight) hours. 30 tablet 0   diphenhydrAMINE HCl, Sleep, (ZZZQUIL PO) Take 30 mLs by mouth daily as needed (takes at bedtime for sleep).     HYDROcodone-acetaminophen (NORCO/VICODIN) 5-325 MG tablet Take 1 tablet by mouth every 4 (four) hours as needed for moderate pain. 30 tablet 0   levETIRAcetam (KEPPRA) 500 MG tablet Take 1 tablet (500 mg total) by mouth 2 (two) times daily. 60 tablet 0   Multiple Vitamins-Minerals (COMPLETE MULTIVITAMIN/MINERAL PO) Take by mouth.     Sennosides-Docusate Sodium (STOOL  SOFTENER/LAXATIVE PO) Take by mouth.     tamsulosin (FLOMAX) 0.4 MG CAPS capsule Take 1 capsule (0.4 mg total) by mouth daily. 30 capsule 0   escitalopram (LEXAPRO) 10 MG tablet Take 1 tablet (10 mg total) by mouth daily. 30 tablet 3   losartan (COZAAR) 25 MG tablet Take 1 tablet (25 mg total) by mouth daily. 90 tablet 1   metoprolol succinate (TOPROL XL) 50 MG 24 hr tablet Take 1 tablet (50 mg total) by mouth daily. Take with or immediately following a meal. 90 tablet 1   Chlorpheniramine-DM (COUGH & COLD PO) Take 5 mLs by mouth every 6 (six) hours as needed (cough). (Patient not taking: Reported on 07/07/2021)     No facility-administered medications prior to visit.    No Known Allergies  ROS Review of Systems  Constitutional:  Negative for chills, fatigue and fever.  HENT:  Negative for congestion, rhinorrhea, sinus pressure and sinus pain.   Eyes:  Negative for pain, discharge and visual disturbance.  Respiratory:  Negative for chest tightness, shortness of breath and wheezing.   Cardiovascular:  Negative for chest pain and palpitations.  Gastrointestinal:  Negative for constipation, diarrhea, nausea and vomiting.  Endocrine: Negative for polydipsia, polyphagia and polyuria.  Genitourinary:  Negative for difficulty urinating, dysuria and urgency.  Skin:  Negative for wound.       Incision on the left side of the head  Neurological:  Positive for weakness (lower extremities) and headaches (with overexertion and insufficient sleep). Negative for light-headedness.  Hematological:  Bruises/bleeds easily.  Psychiatric/Behavioral:  Positive for sleep disturbance. Negative for self-injury.      Objective:    Physical Exam HENT:     Head: Normocephalic.  Cardiovascular:     Rate and Rhythm: Regular rhythm.     Heart sounds: Normal heart sounds.  Genitourinary:    Comments: Foley bag in place Musculoskeletal:     Cervical back: No rigidity or tenderness.     Comments: +5 strength  upper extremities +3 strength lower extremities  Skin:    General: Skin is warm.     Findings: Bruising (above and below left eye and both arms) present.     Comments:  Incisions on the left side of head C/D/I with no surrounding erythema with stables in place. Incision is well approximated and healing appropriately.  Neurological:     Mental Status: He is alert and oriented to person, place, and time.     GCS: GCS eye subscore is 4. GCS verbal subscore is 5. GCS motor subscore is 6.     Cranial Nerves: No facial asymmetry.     Motor: No weakness or pronator drift.     Coordination: Heel to [facility] Test abnormal (fatigued easily and had to sit down). Finger-Nose-Finger Test normal.    BP 116/62   Pulse 100   Ht '5\' 10"'$  (1.778 m)   Wt 151 lb 9.6 oz (  68.8 kg)   SpO2 100%   BMI 21.75 kg/m  Wt Readings from Last 3 Encounters:  07/07/21 151 lb 9.6 oz (68.8 kg)  06/23/21 154 lb 8.7 oz (70.1 kg)  05/23/21 159 lb 3.2 oz (72.2 kg)    No results found for: TSH Lab Results  Component Value Date   WBC 17.3 (H) 06/23/2021   HGB 12.6 (L) 06/23/2021   HCT 36.9 (L) 06/23/2021   MCV 93.4 06/23/2021   PLT 259 06/23/2021   Lab Results  Component Value Date   NA 141 06/23/2021   K 4.0 06/23/2021   CO2 23 06/23/2021   GLUCOSE 127 (H) 06/23/2021   BUN 33 (H) 06/23/2021   CREATININE 1.12 06/23/2021   CALCIUM 8.4 (L) 06/23/2021   ANIONGAP 5 06/23/2021   No results found for: CHOL No results found for: HDL No results found for: LDLCALC No results found for: TRIG No results found for: CHOLHDL No results found for: HGBA1C    Assessment & Plan:   Problem List Items Addressed This Visit       Cardiovascular and Mediastinum   Essential hypertension   Relevant Medications   metoprolol succinate (TOPROL XL) 50 MG 24 hr tablet   losartan (COZAAR) 25 MG tablet   CHF (congestive heart failure) ([facility])   Relevant Medications   metoprolol succinate (TOPROL XL) 50 MG 24 hr tablet   losartan  (COZAAR) 25 MG tablet     Nervous and Auditory   Benign neoplasm of brain, unspecified ([place]) - Primary    -patient is doing well since surgery but will benefit from OT and PT  -referral was placed prior to discharge from the hospital -wife asked if the foley bag could be removed and resume I&O catheterization  -inform patient that was fine -following up for stables removal with Dr.Vaslow, appt has not been set yet -labs and imaging reviewed -pathology pending            Other   Current moderate episode of major depressive disorder without prior episode ([place])   Relevant Medications   escitalopram (LEXAPRO) 10 MG tablet    Meds ordered this encounter  Medications   metoprolol succinate (TOPROL XL) 50 MG 24 hr tablet    Sig: Take 1 tablet (50 mg total) by mouth daily. Take with or immediately following a meal.    Dispense:  90 tablet    Refill:  1   losartan (COZAAR) 25 MG tablet    Sig: Take 1 tablet (25 mg total) by mouth daily.    Dispense:  90 tablet    Refill:  1   escitalopram (LEXAPRO) 10 MG tablet    Sig: Take 1 tablet (10 mg total) by mouth daily.    Dispense:  30 tablet    Refill:  3    Dose change    Follow-up: No follow-ups on file.    Alvira Monday, FNP

## 2021-07-07 NOTE — Assessment & Plan Note (Addendum)
-  patient is doing well since surgery but will benefit from OT and PT  -referral was placed prior to discharge from the hospital -wife asked if the foley bag could be removed and resume I&O catheterization  -inform patient that was fine -following up for stables removal with Dr.Vaslow, appt has not been set yet -labs and imaging reviewed -pathology pending

## 2021-07-08 ENCOUNTER — Encounter (HOSPITAL_COMMUNITY): Payer: Self-pay

## 2021-07-10 LAB — SURGICAL PATHOLOGY

## 2021-07-14 ENCOUNTER — Ambulatory Visit: Payer: Self-pay | Admitting: Internal Medicine

## 2021-07-18 ENCOUNTER — Inpatient Hospital Stay: Payer: Self-pay | Attending: Internal Medicine | Admitting: Internal Medicine

## 2021-07-18 ENCOUNTER — Other Ambulatory Visit (HOSPITAL_COMMUNITY): Payer: Self-pay

## 2021-07-18 ENCOUNTER — Other Ambulatory Visit: Payer: Self-pay

## 2021-07-18 ENCOUNTER — Telehealth: Payer: Self-pay | Admitting: Pharmacist

## 2021-07-18 ENCOUNTER — Encounter: Payer: Self-pay | Admitting: Internal Medicine

## 2021-07-18 VITALS — BP 107/70 | HR 76 | Temp 96.8°F | Resp 16 | Wt 154.3 lb

## 2021-07-18 DIAGNOSIS — C719 Malignant neoplasm of brain, unspecified: Secondary | ICD-10-CM

## 2021-07-18 DIAGNOSIS — C718 Malignant neoplasm of overlapping sites of brain: Secondary | ICD-10-CM | POA: Insufficient documentation

## 2021-07-18 MED ORDER — DEXAMETHASONE 2 MG PO TABS: 2.0000 mg | ORAL_TABLET | Freq: Every day | ORAL | 0 refills | Status: DC

## 2021-07-18 MED ORDER — TEMOZOLOMIDE 140 MG PO CAPS
140.0000 mg | ORAL_CAPSULE | Freq: Every day | ORAL | 0 refills | Status: DC
  Filled 2021-07-18: qty 42, 42d supply, fill #0

## 2021-07-18 MED ORDER — ONDANSETRON HCL 8 MG PO TABS
8.0000 mg | ORAL_TABLET | Freq: Two times a day (BID) | ORAL | 1 refills | Status: DC | PRN
  Filled 2021-07-18: qty 30, 15d supply, fill #0

## 2021-07-18 MED ORDER — LEVETIRACETAM 500 MG PO TABS: 500.0000 mg | ORAL_TABLET | Freq: Two times a day (BID) | ORAL | 1 refills | Status: DC

## 2021-07-18 NOTE — Progress Notes (Signed)

## 2021-07-18 NOTE — Addendum Note (Signed)
Addended by: Ventura Sellers on: 07/18/2021 04:42 PM   Modules accepted: Orders

## 2021-07-18 NOTE — Progress Notes (Signed)
[place] at [place], [place] [address] [phone]   New Patient Evaluation  Date of Service: 07/18/21 Patient Name: Todd [name] Patient MRN: [id number] Patient DOB: 1960/04/18 Provider: Ventura Sellers, MD  Identifying Statement:  Todd [name] is a 61 y.o. male with left frontal glioblastoma who presents for initial consultation and evaluation.    Referring Provider: Lindell Spar, MD [address] [place],  [place] [address]  Oncologic History: Oncology History  Glioblastoma, IDH-wildtype [facility])  06/20/2021 Initial Diagnosis   Glioblastoma, IDH-wildtype ([place])   06/22/2021 Surgery   Left frontotemporal craniotomy, resection by Dr. Arnoldo Morale.  Path is GBM IDHwt.     Biomarkers:  MGMT Unknown.  IDH 1/2 Wild type.  EGFR Unknown  TERT Unknown   History of Present Illness: The patient's records from the referring physician were obtained and reviewed and the patient interviewed to confirm this HPI.  Todd [name], Todd [name], headaches.  In addition, he had at least one full convulsion/seizure event.  CNS imaging done at [facility] ED demonstrated an enhancing left frontal and temporal mass lesion.  He underwent craniotomy and debulking resection with Dr. Arnoldo Morale on 06/22/21; path demonstrated glioblastoma.  Following surgery, he did not experiencing worsening of weakness, gait, or cognition.  Headaches have persisted however, near daily.  No seizure events since prior to surgery.  Currently living at home with his wife, walking independently, and taking care of own activities of daily living.  He has a chronic speech impediment which is unchanged from prior baseline.  Decadron was dosed at $Remove'4mg'cWOrXgI$  TID and stopped abruptly ~5 days ago without taper.  Medications: Current Outpatient Medications on File Prior to Visit  Medication Sig  Dispense Refill   Chlorpheniramine-DM (COUGH & COLD PO) Take 5 mLs by mouth every 6 (six) hours as needed (cough). (Patient not taking: Reported on 07/07/2021)     dexamethasone (DECADRON) 4 MG tablet Take 1 tablet (4 mg total) by mouth every 8 (eight) hours. 30 tablet 0   diphenhydrAMINE HCl, Sleep, (ZZZQUIL PO) Take 30 mLs by mouth daily as needed (takes at bedtime for sleep).     escitalopram (LEXAPRO) 10 MG tablet Take 1 tablet (10 mg total) by mouth daily. 30 tablet 3   HYDROcodone-acetaminophen (NORCO/VICODIN) 5-325 MG tablet Take 1 tablet by mouth every 4 (four) hours as needed for moderate pain. 30 tablet 0   levETIRAcetam (KEPPRA) 500 MG tablet Take 1 tablet (500 mg total) by mouth 2 (two) times daily. 60 tablet 0   losartan (COZAAR) 25 MG tablet Take 1 tablet (25 mg total) by mouth daily. 90 tablet 1   metoprolol succinate (TOPROL XL) 50 MG 24 hr tablet Take 1 tablet (50 mg total) by mouth daily. Take with or immediately following a meal. 90 tablet 1   Multiple Vitamins-Minerals (COMPLETE MULTIVITAMIN/MINERAL PO) Take by mouth.     Sennosides-Docusate Sodium (STOOL SOFTENER/LAXATIVE PO) Take by mouth.     tamsulosin (FLOMAX) 0.4 MG CAPS capsule Take 1 capsule (0.4 mg total) by mouth daily. 30 capsule 0   No current facility-administered medications on file prior to visit.    Allergies: No Known Allergies Past Medical History:  Past Medical History:  Diagnosis Date   Anxiety    CHF (congestive heart failure) ([place])    Depression    Past Surgical History:  Past Surgical History:  Procedure Laterality Date  APPLICATION OF CRANIAL NAVIGATION Left 06/22/2021   Procedure: APPLICATION OF CRANIAL NAVIGATION;  Surgeon: Newman Pies, MD;  Location: [place];  Service: Neurosurgery;  Laterality: Left;   CRANIOTOMY Left 06/22/2021   Procedure: LEFT CRANIOTOMY FOR TUMOR EXCISION;  Surgeon: Newman Pies, MD;  Location: [place];  Service: Neurosurgery;  Laterality: Left;   RADIOLOGY WITH  ANESTHESIA N/A 06/22/2021   Procedure: MRI BRAIN  WITH ANESTHESIA;  Surgeon: Radiologist, Medication, MD;  Location: [place];  Service: Radiology;  Laterality: N/A;   Social History:  Social History   Socioeconomic History   Marital status: Married    Spouse name: Not on file   Number of children: Not on file   Years of education: Not on file   Highest education level: Not on file  Occupational History   Not on file  Tobacco Use   Smoking status: Former   Smokeless tobacco: Never  Vaping Use   Vaping Use: Every day   Substances: Nicotine, Flavoring  Substance and Sexual Activity   Alcohol use: Never   Drug use: Never   Sexual activity: Yes    Birth control/protection: None  Other Topics Concern   Not on file  Social History Narrative   Not on file   Social Determinants of Health   Financial Resource Strain: Not on file  Food Insecurity: Not on file  Transportation Needs: Not on file  Physical Activity: Not on file  Stress: Not on file  Social Connections: Not on file  Intimate Partner Violence: Not on file   Family History:  Family History  Problem Relation Age of Onset   Cancer Mother    Dementia Father     Review of Systems: Constitutional: Doesn't report fevers, chills or abnormal weight loss Eyes: Doesn't report blurriness of vision Ears, nose, mouth, throat, and face: Doesn't report sore throat Respiratory: Doesn't report cough, dyspnea or wheezes Cardiovascular: Doesn't report palpitation, chest discomfort  Gastrointestinal:  Doesn't report nausea, constipation, diarrhea GU: Doesn't report incontinence Skin: Doesn't report skin rashes Neurological: Per HPI Musculoskeletal: Doesn't report joint pain Behavioral/Psych: Doesn't report anxiety  Physical Exam: Vitals:   07/18/21 1411  BP: 107/70  Pulse: 76  Resp: 16  Temp: (!) 96.8 F (36 C)  SpO2: 98%   KPS: 80. General: Alert, cooperative, pleasant, in no acute distress Head: Normal EENT: No  conjunctival injection or scleral icterus.  Lungs: Resp effort normal Cardiac: Regular rate Abdomen: Non-distended abdomen Skin: No rashes cyanosis or petechiae. Extremities: No clubbing or edema  Neurologic Exam: Mental Status: Awake, alert, attentive to examiner. Oriented to self and environment. Language is noted for mild dysphasia, chronic disarticulation and stutter. Cranial Nerves: Visual acuity is grossly normal. Visual fields are full. Extra-ocular movements intact. No ptosis. Face is symmetric Motor: Tone and bulk are normal. Power is full in both arms and legs. Reflexes are symmetric, no pathologic reflexes present.  Sensory: Intact to light touch Gait: Normal.   Labs: I have reviewed the data as listed    Component Value Date/Time   NA 141 06/23/2021 0150   K 4.0 06/23/2021 0150   CL 113 (H) 06/23/2021 0150   CO2 23 06/23/2021 0150   GLUCOSE 127 (H) 06/23/2021 0150   BUN 33 (H) 06/23/2021 0150   CREATININE 1.12 06/23/2021 0150   CALCIUM 8.4 (L) 06/23/2021 0150   GFRNONAA >60 06/23/2021 0150   Lab Results  Component Value Date   WBC 17.3 (H) 06/23/2021   HGB 12.6 (L) 06/23/2021  HCT 36.9 (L) 06/23/2021   MCV 93.4 06/23/2021   PLT 259 06/23/2021    Imaging:  CT Head Wo Contrast  Result Date: 06/20/2021 CLINICAL DATA:  Mental status change, unknown cause thank you empty capped and obvious EXAM: CT HEAD WITHOUT CONTRAST TECHNIQUE: Contiguous axial images were obtained from the base of the skull through the vertex without intravenous contrast. RADIATION DOSE REDUCTION: This exam was performed according to the departmental dose-optimization program which includes automated exposure control, adjustment of the mA and/or kV according to patient size and/or use of iterative reconstruction technique. COMPARISON:  None Available. FINDINGS: Brain: Masslike, somewhat hyperdense area in the left frontal and temporal lobe, which measures approximately 6.2 x 4.6 x 4.4 cm (series  2, image 16 and series 4, image 43), although the borders are difficult to delineate. There areas of hyperdensity within the mass, most likely hemorrhage. Surrounding hypodensity, likely edema. Mass effect, effacing the frontal horn, body, and temporal horn of the left lateral ventricle, as well as the third ventricle, with suspected resulting enlargement of the right ventricle and left occipital horn. Mass effect on the midbrain, with likely effacement of the ambient cisterns. Approximately 12 mm of left-to-right midline shift. No extra-axial collection. Vascular: No hyperdense vessel. Skull: Normal. Negative for fracture or focal lesion. Sinuses/Orbits: No acute finding. Other: Trace fluid in the right-greater-than-left mastoid air cells. IMPRESSION: Masslike area in the left frontal and temporal lobe, favored to represent a primary neoplasm with areas of internal hemorrhage, with a large infarct with hemorrhagic transformation felt to be less likely. This causes significant surrounding edema and mass effect, with 12 mm of left-to-right midline shift, as well as effacement of left lateral ventricle and third ventricle, with likely enlargement of the right lateral ventricle and left occipital horn. An MRI with and without contrast is recommended. These results were called by telephone at the time of interpretation on 06/20/2021 at 11:18 am to provider [facility] , who verbally acknowledged these results. Electronically Signed   By: Merilyn Baba M.D.   On: 06/20/2021 11:19   MR BRAIN W WO CONTRAST  Result Date: 06/22/2021 CLINICAL DATA:  Brain/CNS neoplasm, monitor EXAM: MRI HEAD WITHOUT AND WITH CONTRAST TECHNIQUE: Multiplanar, multiecho pulse sequences of the brain and surrounding structures were obtained without and with intravenous contrast. CONTRAST:  20mL GADAVIST GADOBUTROL 1 MMOL/ML IV SOLN COMPARISON:  MRI head Jun 20, 2021. FINDINGS: Brain: In comparison to recent MRI head, no substantial change in a  large, heterogeneously enhancing mass involving the left temporal and frontal lobes and insula. Similar cortical thickening and adjacent satellite nodule, detailed on the prior. Similar moderate surrounding T2/stir hyperintense signal. Similar mass effect on the left basal ganglia, left lateral ventricle and third ventricle with similar approximately 1.2 cm of rightward midline shift. Similar mass effect on the midbrain. Mild dilation of the right lateral ventricle is unchanged. No evidence of acute infarct. Vascular: Major intracranial vessels are maintained at the skull base. Displacement left MCA vessels by the mass. Skull and upper cervical spine: Normal marrow signal. Sinuses/Orbits: Clear sinuses.  No acute orbital findings. Other: Small bilateral mastoid effusions. 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 Similar regional mass effect and edema with 1.2 cm of rightward midline shift. Similar mild dilation the right lateral ventricle, which may represent early ventricular entrapment. Electronically Signed   By: Margaretha Sheffield M.D.   On: 06/22/2021 14:31   MR Brain W and Wo Contrast  Result  Date: 06/20/2021 CLINICAL DATA:  Brain/CNS neoplasm, staging. Abnormal head CT. Altered mental status. EXAM: MRI HEAD WITHOUT AND WITH CONTRAST TECHNIQUE: Multiplanar, multiecho pulse sequences of the brain and surrounding structures were obtained without and with intravenous contrast. CONTRAST:  54mL GADAVIST GADOBUTROL 1 MMOL/ML IV SOLN COMPARISON:  Head CT 06/20/2021 FINDINGS: Brain: A heterogeneously enhancing mass involving the left temporal lobe, frontal lobe, and insula measures 6.0 x 5.0 cm with areas of internal necrosis and blood products. There is involvement of the cortex which is thickened. An adjacent heterogeneously enhancing satellite nodule in the left temporal lobe measures 2.0 cm. There is a moderate amount of nonenhancing T2 hyperintensity  in the left frontotemporal white matter which may reflect edema and possibly nonenhancing tumor. There is mass effect on the left basal ganglia, left lateral ventricle, and third ventricle with rightward midline shift of 1.3 cm. The right lateral ventricle appears mildly dilated. There is also prominent mass effect on the left midbrain. A few punctate foci of T2 hyperintensity are noted elsewhere in the cerebral white matter bilaterally and in the pons, nonspecific but compatible with chronic small vessel ischemia. No acute infarct or extra-axial fluid collection is identified. Vascular: Major intracranial vascular flow voids are preserved. Displacement of the left MCA by the mass. Skull and upper cervical spine: Unremarkable bone marrow signal. Sinuses/Orbits: Unremarkable orbits. Clear paranasal sinuses. Small bilateral mastoid effusions. Other: None. 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 Regional mass effect and edema with 1.3 cm of rightward midline shift and mild dilatation of the right lateral ventricle. Electronically Signed   By: Logan Bores M.D.   On: 06/20/2021 12:49    Pathology: SURGICAL PATHOLOGY  CASE: [id number]  PATIENT: Ali Lowe  Surgical Pathology Report    Clinical History: left cranial tumor (cm)    FINAL MICROSCOPIC DIAGNOSIS:   A. BRAIN TUMOR, LEFT, BIOPSY:  - Glioblastoma, IDH-wild-type, WHO grade 4, see comment   B. BRAIN TUMOR, LEFT, EXCISION:  - Glioblastoma, IDH-wild-type, WHO grade 4, see comment    COMMENT:   A and B.   This case was sent in consultation to Dr. Moody Bruins at [facility], [place], [place].  A complete copy of the  outside pathology report is available in patient's electronic medical  records.    INTRAOPERATIVE DIAGNOSIS:   A1. BRAIN TUMOR, LEFT, FROZEN SECTION:          Limited neoplastic cells present with associated necrosis.         Rapid intraoperative consult  diagnosis rendered by Dr. Melina Copa @  1626 06/22/2021.    GROSS DESCRIPTION:   Specimen A: Received fresh for rapid intraoperative consult evaluation  for frozen section is a 0.9 x 0.6 x 0.2 cm aggregate of pink-gray to red  soft tissue which is entirely submitted in 1 block for frozen section.   Specimen B: Received fresh for routine histology is a 3.2 x 2.7 x 0.7 cm  aggregate of pink-gray to red soft tissue, entirely submitted in 4  blocks.   SW 06/23/2021    Final Diagnosis performed by Jaquita Folds, MD.   Electronically  signed 07/09/2021    Assessment/Plan Glioblastoma, IDH-wildtype [facility])  We appreciate the opportunity to participate in the care of Aadith Raudenbush.  He presents today with clinical syndrome consistent with left frontotemporal glioblastoma, IDHwt.    We had an extensive conversation with him and his wife regarding pathology, prognosis, and available treatment pathways.  It is difficult to  assess quality of surgery without any post-operative imaging, but size and location of tumor are not favorable.  Despite that, his functional status at present is encouraging.  He would like to move forward with life prolonging measures available to him.    We ultimately recommended proceeding with course of intensity modulated radiation therapy and concurrent daily Temozolomide.  Radiation will be administered Mon-Fri over 6 weeks, Temodar will be dosed at $Remove'75mg'iVIsIoY$ /m2 to be given daily over 42 days.  We reviewed side effects of temodar, including fatigue, nausea/vomiting, constipation, and cytopenias.  Informed consent was verbally obtained at bedside to proceed with oral chemotherapy.  Chemotherapy should be held for the following:  ANC less than 1,000  Platelets less than 100,000  LFT or creatinine greater than 2x ULN  If clinical concerns/contraindications develop  Every 2 weeks during radiation, labs will be checked accompanied by a clinical evaluation in the brain  tumor clinic.  Decadron should be resumed at $RemoveBe'2mg'brtfJUCJw$  daily.  Keppra should continue $RemoveBeforeD'500mg'ifQkCgiqyOeEIT$  BID for now.  We will order post-op MRI brain ASAP for radiation treatment planning.  Screening for potential clinical trials was performed and discussed using eligibility criteria for active protocols at [facility], loco-regional tertiary centers, as well as national database available on [email or web address].com.    The patient is not a candidate for a research protocol at this time due to no suitable study identified.   We spent twenty additional minutes teaching regarding the natural history, biology, and historical experience in the treatment of brain tumors. We then discussed in detail the current recommendations for therapy focusing on the mode of administration, mechanism of action, anticipated toxicities, and quality of life issues associated with this plan. We also provided teaching sheets for the patient to take home as an additional resource.  All questions were answered. The patient knows to call the clinic with any [name], questions or concerns. No barriers to learning were detected.  The total time spent in the encounter was 60 minutes and more than 50% was on counseling and review of test results   Ventura Sellers, MD Medical Director of Neuro-Oncology [facility] at [place] 07/18/21 2:08 PM

## 2021-07-18 NOTE — Telephone Encounter (Signed)
Oral Oncology Pharmacist Encounter  Received new prescription for Temodar (temozolomide) for the treatment of glioblastoma in conjunction with radiation, planned duration 42 days.  CBC and BMP from 06/23/21 assessed, no baseline dose adjustments required. Prescription dose and frequency assessed for appropriateness.  Current medication list in Epic reviewed, no relevant/significant DDIs with Temodar identified.  Evaluated chart and no patient barriers to medication adherence noted.   Patient agreement for treatment documented in MD note on 07/18/21.  Prescription has been e-scribed to the [facility] for benefits analysis and approval.  Oral Oncology Clinic will continue to follow for insurance authorization, copayment issues, initial counseling and start date.  Leron Croak, PharmD, BCPS Hematology/Oncology Clinical Pharmacist [name] and [place] [phone] 07/18/2021 4:21 PM

## 2021-07-19 ENCOUNTER — Inpatient Hospital Stay: Payer: Self-pay

## 2021-07-19 ENCOUNTER — Other Ambulatory Visit (HOSPITAL_COMMUNITY): Payer: Self-pay

## 2021-07-19 ENCOUNTER — Telehealth: Payer: Self-pay | Admitting: Pharmacy Technician

## 2021-07-19 ENCOUNTER — Other Ambulatory Visit: Payer: Self-pay | Admitting: Radiation Therapy

## 2021-07-19 ENCOUNTER — Other Ambulatory Visit: Payer: Self-pay | Admitting: *Deleted

## 2021-07-19 DIAGNOSIS — C719 Malignant neoplasm of brain, unspecified: Secondary | ICD-10-CM

## 2021-07-19 NOTE — Telephone Encounter (Signed)
Oral Oncology Patient Advocate Encounter  After completing a benefits investigation, prior authorization for Temozolomide (Temodar) is not required at this time as patient is uninsured.  Patient may be eligible to get medication from [facility] for $231.58 for a 30 day supply.    Lady Deutscher, CPhT-Adv Pharmacy Patient Advocate Specialist [facility] Patient Advocate Team Direct Number: [phone]  Fax: [phone]

## 2021-07-19 NOTE — Progress Notes (Signed)
[place] Work  Initial Assessment   Todd [name] is a 61 y.o. year old male contacted caregiver by phone. Clinical Social Work was referred by medical provider for assessment of psychosocial needs.   SDOH (Social Determinants of Health) assessments performed: Yes SDOH Interventions    Flowsheet Row Most Recent Value  SDOH Interventions   Food Insecurity Interventions Intervention Not Indicated  Housing Interventions Intervention Not Indicated  Transportation Interventions Intervention Not Indicated       SDOH Screenings   Alcohol Screen: Not on file  Depression (PHQ2-9): Low Risk    PHQ-2 Score: 0  Financial Resource Strain: Low Risk    Difficulty of Paying Living Expenses: Not hard at all  Food Insecurity: No Food Insecurity   Worried About Charity fundraiser in the Last Year: Never true   Arboriculturist in the Last Year: Never true  Housing: Low Risk    Last Housing Risk Score: 0  Physical Activity: Not on file  Social Connections: Not on file  Stress: Not on file  Tobacco Use: Medium Risk   Smoking Tobacco Use: Former   Smokeless Tobacco Use: Never   Passive Exposure: Not on file  Transportation Needs: No Transportation Needs   Lack of Transportation (Medical): No   Lack of Transportation (Non-Medical): No     Distress Screen completed: No     View : No data to display.            Family/Social Information:  Housing Arrangement: patient lives with his wife, Todd [name].  She goes by [name]. Family members/support persons in your life? Family, Friends, and Education administrator.  Patient's daughter lives 1.5 hours from him. Transportation concerns: no.  Patient's spouse transports him. Employment: Unemployed.  Spouse reports patient had to quit working due to CHF. Income source: Supported by [employer] and [employer] Financial concerns: Yes, due to illness and/or loss of work during treatment Type of concern: Medical bills Food access concerns: no Religious  or spiritual practice: No Services Currently in place:  None  Coping/ Adjustment to diagnosis: Patient understands treatment plan and what happens next? Caregiver expressed understanding. Concerns about diagnosis and/or treatment: Todd [name] expressed concern about future medical bills. Patient reported stressors: Depression, Anxiety/ nervousness, and Adjusting to my illness per spouse. Hopes and/or priorities: Patient getting all of his needs met. Current coping skills/ strengths: Supportive family/friends    SUMMARY: Current SDOH Barriers:  Mental Health Concerns   Clinical Social Work Clinical Goal(s):  Patient will work with SW to address concerns related to future financial concerns.  Interventions: Discussed common feeling and emotions when being diagnosed with cancer, and the importance of support during treatment Informed patient of the support team roles and support services at [facility] Provided CSW contact information and encouraged patient to call with any questions or concerns Provided education regarding available resources.   Follow Up Plan: CSW will follow-up with patient by phone  Patient verbalizes understanding of plan: Yes    Rodman Pickle Ryanne Morand, LCSW

## 2021-07-20 ENCOUNTER — Ambulatory Visit: Payer: Self-pay | Admitting: Radiation Oncology

## 2021-07-21 ENCOUNTER — Ambulatory Visit
Admission: RE | Admit: 2021-07-21 | Discharge: 2021-07-21 | Disposition: A | Discharge status: Home / Self Care | Payer: Self-pay | Source: Ambulatory Visit | Attending: Internal Medicine | Admitting: Internal Medicine

## 2021-07-21 ENCOUNTER — Inpatient Hospital Stay: Payer: Self-pay | Admitting: Licensed Clinical Social Worker

## 2021-07-21 DIAGNOSIS — C719 Malignant neoplasm of brain, unspecified: Secondary | ICD-10-CM | POA: Insufficient documentation

## 2021-07-21 IMAGING — MR MR HEAD WO/W CM
15 series · 48 of 48 positions shown · IV contrast (gadavist)
Comparison: [DATE]

CLINICAL DATA: Brain/CNS neoplasm, assess treatment response;
glioblastoma post craniotomy [DATE]

EXAM:
MRI HEAD WITHOUT AND WITH CONTRAST
TECHNIQUE: Multiplanar, multiecho pulse sequences of the brain and surrounding
structures were obtained without and with intravenous contrast.
CONTRAST:  7mL GADAVIST GADOBUTROL 1 MMOL/ML IV SOLN

[Series 5: ax dwi_tracew · axial · 3.0mm · 0.65mm/px · z∈[-112,+43]mm · 3 of 48 slices shown]
[im 1/48]
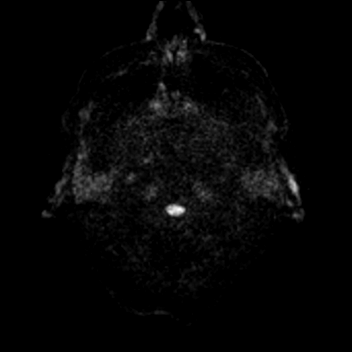
[im 24/48]
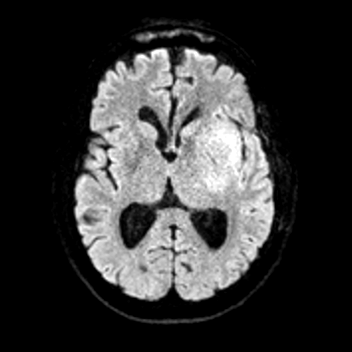
[im 48/48]
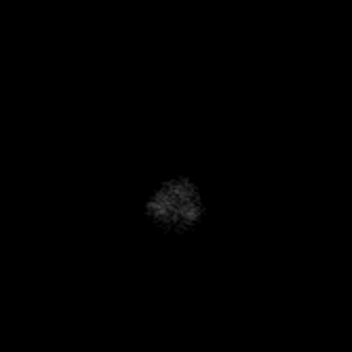

[Series 6: ax dwi_adc · axial · 3.0mm · 0.65mm/px · z∈[-112,+43]mm · 3 of 48 slices shown]
[im 1/48]
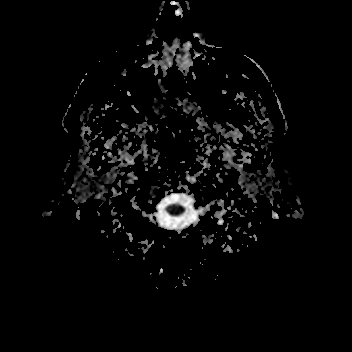
[im 24/48]
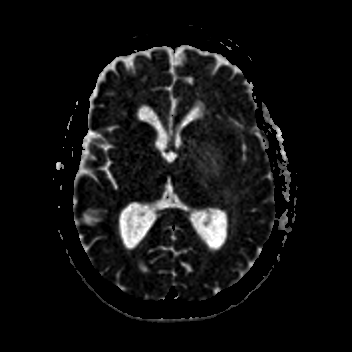
[im 48/48]
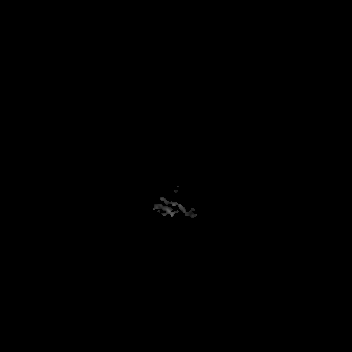

[Series 7: cor dwi_tracew · coronal · 5.0mm · 0.60mm/px · 2 of 38 slices shown]
[im 1/38]
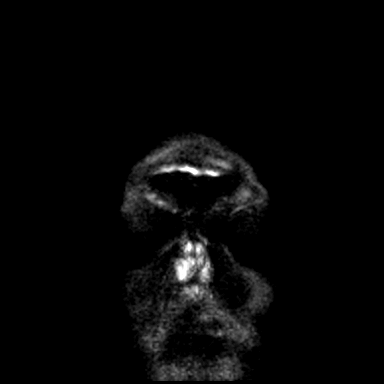
[im 38/38]
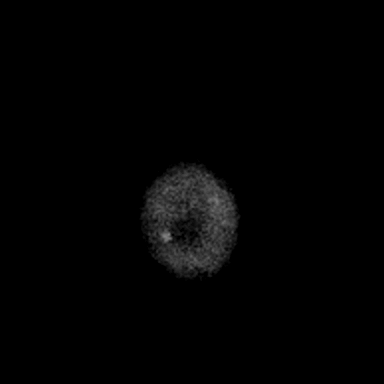

[Series 8: cor dwi_adc · coronal · 5.0mm · 0.60mm/px · 2 of 38 slices shown]
[im 1/38]
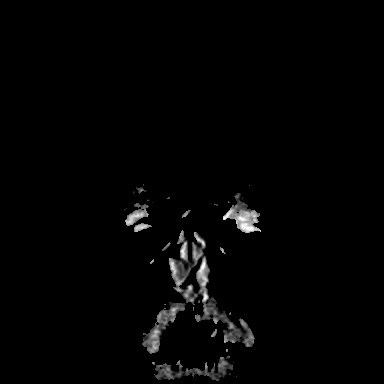
[im 38/38]
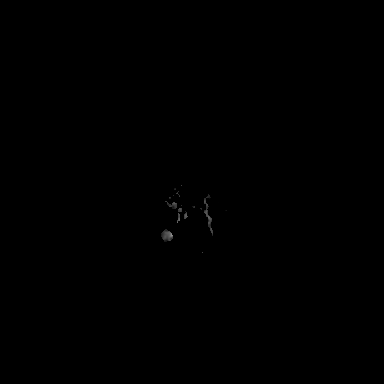

[Series 9: T1 · sagittal · 5.0mm · 0.62mm/px · 1 of 23 slices shown (1 of 2)]
[im 1/23]
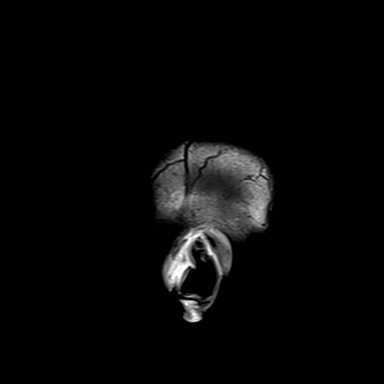

[Series 10: T2 · axial · 5.0mm · 0.53mm/px · 1 of 27 slices shown]
[im 1/27]
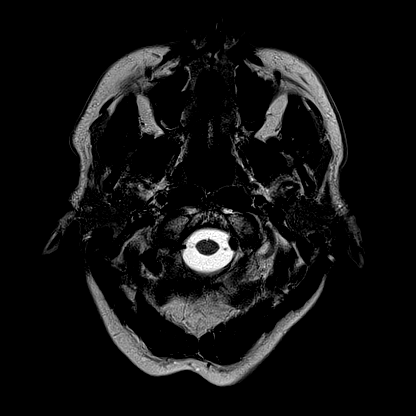

[Series 11: ax swi_mag · axial · 2.0mm · 0.90mm/px · z∈[-113,+45]mm · 4 of 80 slices shown]
[im 1/80]
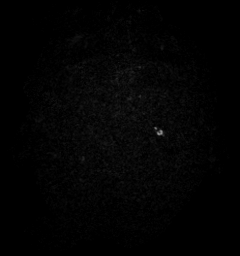
[im 27/80]
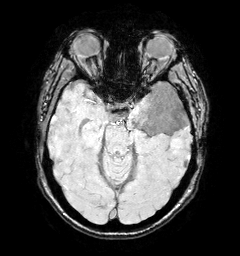
[im 53/80]
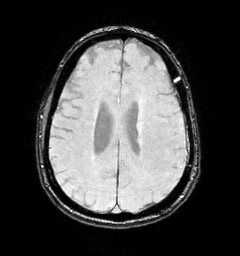
[im 80/80]
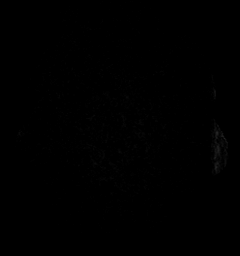

[Series 12: ax swi_pha · axial · 2.0mm · 0.90mm/px · z∈[-113,+45]mm · 4 of 80 slices shown]
[im 1/80]
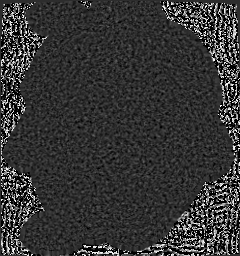
[im 27/80]
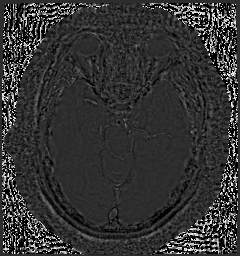
[im 53/80]
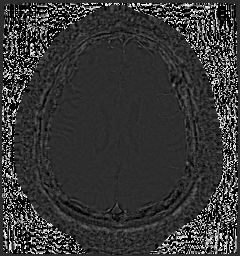
[im 80/80]
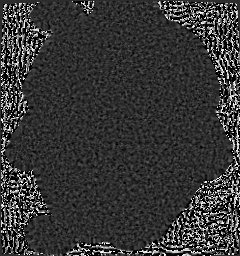

[Series 13: ax swi_swi · axial · 2.0mm · 0.90mm/px · z∈[-113,+45]mm · 4 of 80 slices shown]
[im 1/80]
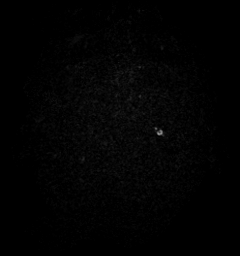
[im 27/80]
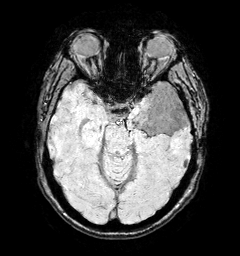
[im 53/80]
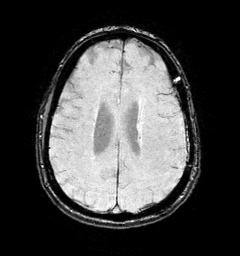
[im 80/80]
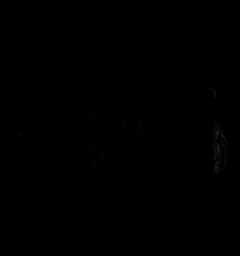

[Series 15: FLAIR · axial · 3.0mm · 0.53mm/px · z∈[-115,+47]mm · 3 of 55 slices shown]
[im 1/55]
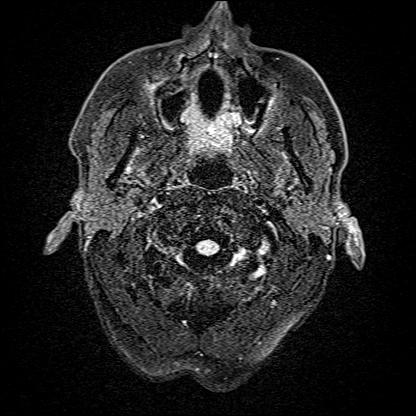
[im 28/55]
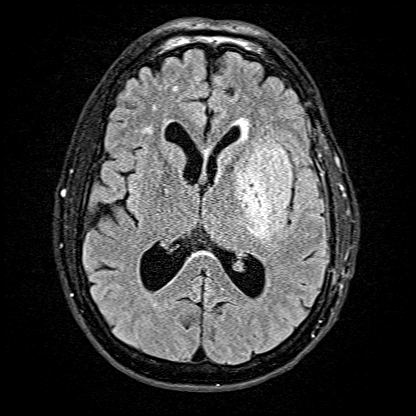
[im 55/55]
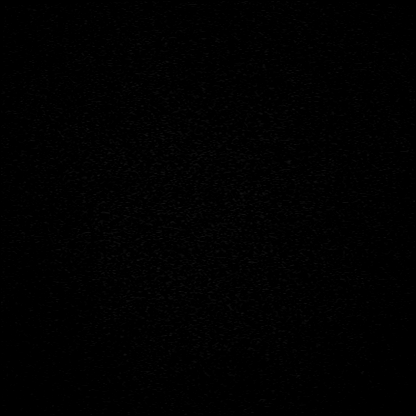

[Series 16: T1 · axial · 1.0mm · 0.98mm/px · z∈[-114,+45]mm · 8 of 160 slices shown (2 of 2)]
[im 1/160]
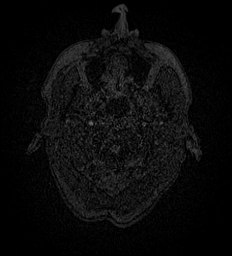
[im 23/160]
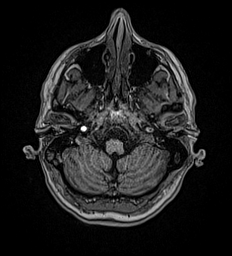
[im 46/160]
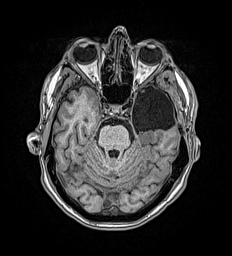
[im 69/160]
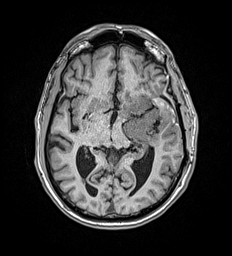
[im 91/160]
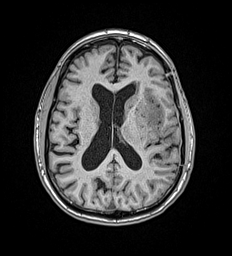
[im 114/160]
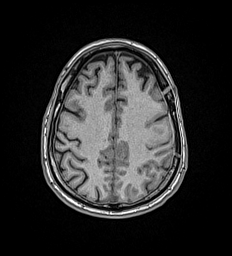
[im 137/160]
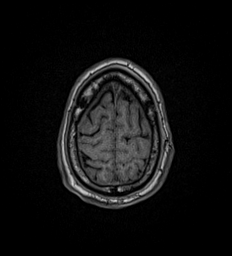
[im 160/160]
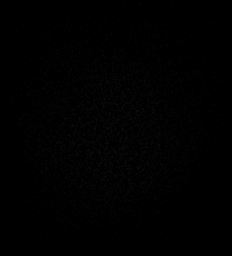

[Series 17: T2 post-contrast · coronal · 5.0mm · 0.57mm/px · 2 of 29 slices shown]
[im 1/29]
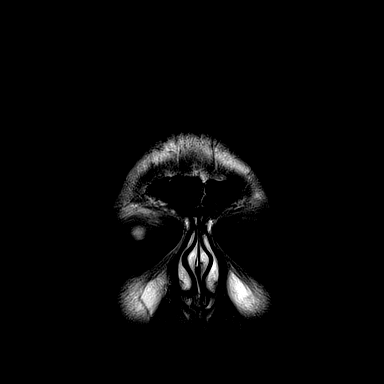
[im 29/29]
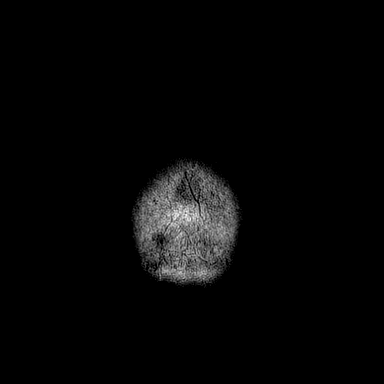

[Series 18: T1 post-contrast · axial · 1.0mm · 0.98mm/px · z∈[-114,+45]mm · 8 of 160 slices shown (1 of 3)]
[im 1/160]
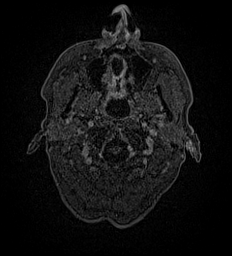
[im 23/160]
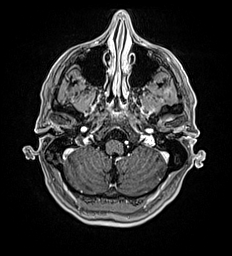
[im 46/160]
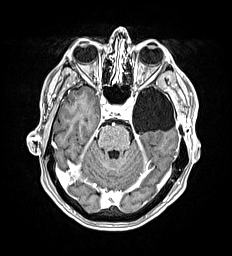
[im 69/160]
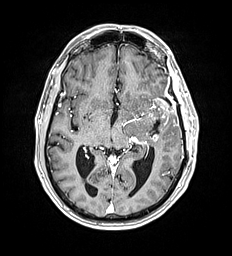
[im 91/160]
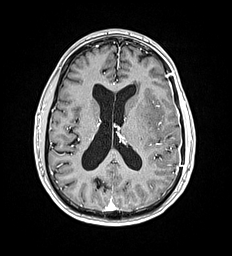
[im 114/160]
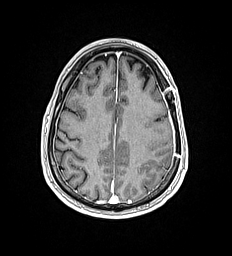
[im 137/160]
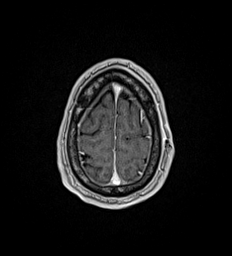
[im 160/160]
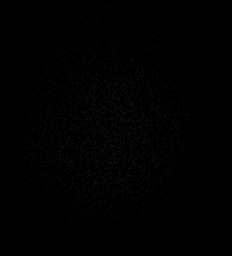

[Series 19: T1 post-contrast · coronal · 5.0mm · 0.57mm/px · 2 of 29 slices shown (2 of 3)]
[im 1/29]
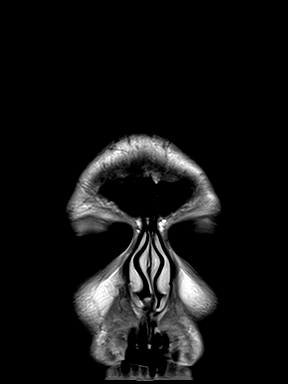
[im 29/29]
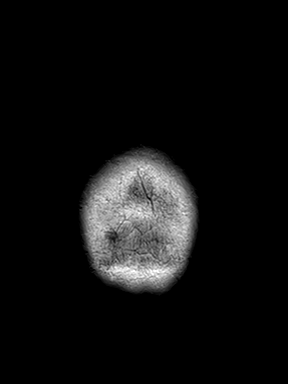

[Series 20: T1 post-contrast · sagittal · 5.0mm · 0.62mm/px · 1 of 23 slices shown (3 of 3)]
[im 1/23]
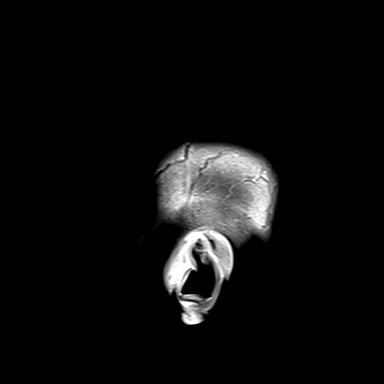

[48 of 48 positions shown; findings below may reference images not displayed]

FINDINGS: Brain: Interval postoperative changes with significant debulking of
left frontotemporal mass. There is no immediate postoperative study
for comparison. A left temporal near CSF intensity resection cavity
is present along the anterior left temporal lobe. Enhancement is
present along the resection cavity margins. Given irregular
appearance of the preoperative enhancement, residual tumor is
difficult to exclude. Substantially decreased edema. Infiltrating T2
hyperintense tumor remains present deep and superior to the
resection cavity including involvement of the uncus, hippocampus,
basal ganglia, and insula. Mass effect has substantially improved
with essentially resolved midline shift. There remains flattening of
the left cerebral peduncle by the expansile medial temporal tumor.

No acute infarction or hydrocephalus.

Vascular: Major vessel flow voids at the skull base are preserved.

Skull and upper cervical spine: Normal marrow signal is preserved.

Sinuses/Orbits: Paranasal sinuses are aerated. Orbits are
unremarkable.

Other: Sella is unremarkable.  Mastoid air cells are clear.
IMPRESSION: No immediate postoperative studies available for comparison. Gross
total resection of the enhancing component of the left
frontotemporal mass. Difficult to exclude some residual tumor given
irregular appearance of the preoperative enhancement. Edema has
substantially improved. Remains surrounding nonenhancing
infiltrating tumor including expansion of the medial temporal lobe
with flattening of the left cerebral peduncle.

## 2021-07-21 MED ORDER — GADOBUTROL 1 MMOL/ML IV SOLN
7.0000 mL | Freq: Once | INTRAVENOUS | Status: AC | PRN
  Administered 2021-07-21: 7 mL via INTRAVENOUS

## 2021-07-21 NOTE — Progress Notes (Signed)
[place] CSW Progress Note  Clinical Education officer, museum contacted caregiver by phone to discuss financial concerns and transfer to [facility].  CSW and Ms. Mccutchan discussed transition to [facility] a from [facility] and what financial assistance had been started.  CSW and Ms. Lafortune discussed different possible financial assistance option as well as criteria for assistance qualification. Ms. Coffin and CSW discussed her concerns and adjustment to patient's diagnosis.  CSW went over the role of CSW in patient care, along with family and caregiver support.  Ms. Middleton expressed interest in counseling but will need to wait until they have started treatment at [facility].  CSW stated whenever Ms. Fidel was ready to contact me and/or the RN and they will update me on her request.  Ms. Raimondo verbalized understanding.  CSW contacted Hotel manager, Child psychotherapist Center to request update on patient.  All three replied they are already following patient and will contact Ms. Joa to update her.  CSW received update from [facility] CSW with information on patient's current financial status and possibilities of what, if any, financial assistance programs he may qualify for.  [facility] CSW also informed CSW patient would need look into the  [employer] for possible insurance coverage.    Adelene Amas, LCSW

## 2021-07-25 ENCOUNTER — Ambulatory Visit
Admission: RE | Admit: 2021-07-25 | Discharge: 2021-07-25 | Disposition: A | Discharge status: Home / Self Care | Payer: Self-pay | Source: Ambulatory Visit | Attending: Radiation Oncology | Admitting: Radiation Oncology

## 2021-07-25 ENCOUNTER — Inpatient Hospital Stay: Payer: Self-pay

## 2021-07-25 ENCOUNTER — Other Ambulatory Visit (HOSPITAL_COMMUNITY): Payer: Self-pay

## 2021-07-25 ENCOUNTER — Other Ambulatory Visit: Payer: Self-pay | Admitting: *Deleted

## 2021-07-25 VITALS — BP 100/65 | HR 98 | Temp 97.1°F | Resp 20 | Ht 70.0 in | Wt 156.1 lb

## 2021-07-25 DIAGNOSIS — C719 Malignant neoplasm of brain, unspecified: Secondary | ICD-10-CM

## 2021-07-25 DIAGNOSIS — Z809 Family history of malignant neoplasm, unspecified: Secondary | ICD-10-CM | POA: Insufficient documentation

## 2021-07-25 DIAGNOSIS — Z79899 Other long term (current) drug therapy: Secondary | ICD-10-CM | POA: Insufficient documentation

## 2021-07-25 DIAGNOSIS — Z87891 Personal history of nicotine dependence: Secondary | ICD-10-CM | POA: Insufficient documentation

## 2021-07-25 DIAGNOSIS — Z7952 Long term (current) use of systemic steroids: Secondary | ICD-10-CM | POA: Insufficient documentation

## 2021-07-25 DIAGNOSIS — I509 Heart failure, unspecified: Secondary | ICD-10-CM | POA: Insufficient documentation

## 2021-07-25 DIAGNOSIS — Z79630 Long term (current) use of alkylating agent: Secondary | ICD-10-CM | POA: Insufficient documentation

## 2021-07-25 DIAGNOSIS — C711 Malignant neoplasm of frontal lobe: Secondary | ICD-10-CM | POA: Insufficient documentation

## 2021-07-25 MED ORDER — LANSOPRAZOLE 30 MG PO CPDR: 30.0000 mg | DELAYED_RELEASE_CAPSULE | Freq: Every day | ORAL | 3 refills | Status: DC

## 2021-07-25 MED ORDER — ONDANSETRON HCL 8 MG PO TABS: 8.0000 mg | ORAL_TABLET | Freq: Two times a day (BID) | ORAL | 0 refills | Status: DC

## 2021-07-25 NOTE — Consult Note (Signed)
NEW PATIENT EVALUATION  Name: Todd [name]  MRN: [id number]  Date:   07/25/2021     DOB: [birth year]   This 61 y.o. male patient presents to the clinic for initial evaluation of GBM of the left frontal l temporal region status post gross resection.  REFERRING PHYSICIAN: Lindell Spar, MD  CHIEF COMPLAINT:  Chief Complaint  Patient presents with   Consult    Glioblastoma    DIAGNOSIS: The encounter diagnosis was Glioblastoma ([place]).   PREVIOUS INVESTIGATIONS:  MRI scans pre and post surgery reviewed Pathology reports reviewed Clinical notes reviewed  HPI: Patient is a 61 year old male who presented with increasing confusion balance issues and headaches.  He underwent 1 seizure event.  He was seen in [facility] underwent imaging demonstrating left frontal and temporal mass consistent with GBM.  He underwent craniotomy by Dr. Arnoldo Morale in [place] with path showing IDH wild-type GBM.his initial MRI scan showed a 6 cm left frontal top temporal mass concerning for high-grade glioma with mass effect and edema.  Postop MRI showed gross total resection of enhancing component of the left frontal temporal mass.  It was difficult to exclude some residual tumor given irregular appearance of the preoperative enhancement.  Edema had substantially improved.  He has been seen by Dr. Mickeal Skinner is recommending Temodar plus IMRT radiation.  He is seen today for consultation.  He continues to have some headaches is on a low-dose I believe 2 mg of Decadron in the morning.  No change in visual fields or focal neurologic deficits. PLANNED TREATMENT REGIMEN: Temodar plus IMRT partial brain radiation  PAST MEDICAL HISTORY:  has a past medical history of Anxiety, CHF (congestive heart failure) ([place]), and Depression.    PAST SURGICAL HISTORY:  Past Surgical History:  Procedure Laterality Date   APPLICATION OF CRANIAL NAVIGATION Left 06/22/2021   Procedure: APPLICATION OF CRANIAL NAVIGATION;  Surgeon: Newman Pies, MD;  Location: [facility];  Service: Neurosurgery;  Laterality: Left;   CRANIOTOMY Left 06/22/2021   Procedure: LEFT CRANIOTOMY FOR TUMOR EXCISION;  Surgeon: Newman Pies, MD;  Location: [place];  Service: Neurosurgery;  Laterality: Left;   RADIOLOGY WITH ANESTHESIA N/A 06/22/2021   Procedure: MRI BRAIN  WITH ANESTHESIA;  Surgeon: Radiologist, Medication, MD;  Location: [facility];  Service: Radiology;  Laterality: N/A;    FAMILY HISTORY: family history includes Cancer in his mother; Dementia in his father.  SOCIAL HISTORY:  reports that he has quit smoking. He has never used smokeless tobacco. He reports that he does not drink alcohol and does not use drugs.  ALLERGIES: Patient has no known allergies.  MEDICATIONS:  Current Outpatient Medications  Medication Sig Dispense Refill   Chlorpheniramine-DM (COUGH & COLD PO) Take 5 mLs by mouth every 6 (six) hours as needed (cough). (Patient not taking: Reported on 07/07/2021)     dexamethasone (DECADRON) 2 MG tablet Take 1 tablet (2 mg total) by mouth daily. 30 tablet 0   diphenhydrAMINE HCl, Sleep, (ZZZQUIL PO) Take 30 mLs by mouth daily as needed (takes at bedtime for sleep).     escitalopram (LEXAPRO) 10 MG tablet Take 1 tablet (10 mg total) by mouth daily. 30 tablet 3   HYDROcodone-acetaminophen (NORCO/VICODIN) 5-325 MG tablet Take 1 tablet by mouth every 4 (four) hours as needed for moderate pain. 30 tablet 0   levETIRAcetam (KEPPRA) 500 MG tablet Take 1 tablet (500 mg total) by mouth 2 (two) times daily. 120 tablet 1   losartan (COZAAR) 25 MG tablet Take 1  tablet (25 mg total) by mouth daily. 90 tablet 1   metoprolol succinate (TOPROL XL) 50 MG 24 hr tablet Take 1 tablet (50 mg total) by mouth daily. Take with or immediately following a meal. 90 tablet 1   Multiple Vitamins-Minerals (COMPLETE MULTIVITAMIN/MINERAL PO) Take by mouth.     ondansetron (ZOFRAN) 8 MG tablet Take 1 tablet by mouth 2 times daily as needed for nausea/vomiting. May take  30-60 minutes prior to Temodar administration if nausea/vomiting occurs. 30 tablet 1   Sennosides-Docusate Sodium (STOOL SOFTENER/LAXATIVE PO) Take by mouth.     tamsulosin (FLOMAX) 0.4 MG CAPS capsule Take 1 capsule (0.4 mg total) by mouth daily. 30 capsule 0   temozolomide (TEMODAR) 140 MG capsule Take 1 capsule (140 mg total) by mouth daily. May take on an empty stomach to decrease nausea & vomiting. 42 capsule 0   No current facility-administered medications for this encounter.    ECOG PERFORMANCE STATUS:  1 - Symptomatic but completely ambulatory  REVIEW OF SYSTEMS: Patient denies any weight loss, fatigue, weakness, fever, chills or night sweats. Patient denies any loss of vision, blurred vision. Patient denies any ringing  of the ears or hearing loss. No irregular heartbeat. Patient denies heart murmur or history of fainting. Patient denies any chest pain or pain radiating to her upper extremities. Patient denies any shortness of breath, difficulty breathing at night, cough or hemoptysis. Patient denies any swelling in the lower legs. Patient denies any nausea vomiting, vomiting of blood, or coffee ground material in the vomitus. Patient denies any stomach pain. Patient states has had normal bowel movements no significant constipation or diarrhea. Patient denies any dysuria, hematuria or significant nocturia. Patient denies any problems walking, swelling in the joints or loss of balance. Patient denies any skin changes, loss of hair or loss of weight. Patient denies any excessive worrying or anxiety or significant depression. Patient denies any problems with insomnia. Patient denies excessive thirst, polyuria, polydipsia. Patient denies any swollen glands, patient denies easy bruising or easy bleeding. Patient denies any recent infections, allergies or URI. Patient "s visual fields have not changed significantly in recent time.   PHYSICAL EXAM: BP 100/65   Pulse 98   Temp (!) 97.1 F (36.2 C)    Resp 20   Ht $R'5\' 10"'jr$  (1.778 m)   Wt 156 lb 1.6 oz (70.8 kg)   BMI 22.40 kg/m  Crude visual fields are within normal range motor or sensory in detail levels are equal symmetric upper lower extremities proprioception is intact.  Incision is well-healed.  Well-developed well-nourished patient in NAD. HEENT reveals PERLA, EOMI, discs not visualized.  Oral cavity is clear. No oral mucosal lesions are identified. Neck is clear without evidence of cervical or supraclavicular adenopathy. Lungs are clear to A&P. Cardiac examination is essentially unremarkable with regular rate and rhythm without murmur rub or thrill. Abdomen is benign with no organomegaly or masses noted. Motor sensory and DTR levels are equal and symmetric in the upper and lower extremities. Cranial nerves II through XII are grossly intact. Proprioception is intact. No peripheral adenopathy or edema is identified. No motor or sensory levels are noted. Crude visual fields are within normal range.  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  We will treat to 60 Gray over 6 weeks.  Risks and benefits of treatment including skin reaction hair loss fatigue alteration of blood counts all reviewed with the patient.  We will treat the patient with concurrent Temodar.  I have personally set up and ordered CT simulation for later this week.  I am going to increase his Decadron to 2 mg twice a day based on his persistent headaches.  I am also starting patient on Prevacid to cover his GI toxicity.  Patient wife both comprehend my treatment plan well.  I would like to take this opportunity to thank you for allowing me to participate in the care of your  patient.Noreene Filbert, MD

## 2021-07-26 ENCOUNTER — Telehealth: Payer: Self-pay | Admitting: *Deleted

## 2021-07-26 ENCOUNTER — Encounter: Payer: Self-pay | Admitting: Radiation Oncology

## 2021-07-26 NOTE — Progress Notes (Signed)
Review of patient qualifications for one-time $1000 [place] grant to assist with personal expenses while undergoing treatment based upon referral from Oncology CSW.  Provided [facility] Assistance agreement and information on expenses for review at next appointment along with my contact information for any additional questions.

## 2021-07-26 NOTE — Telephone Encounter (Signed)
Joycelyn Schmid called stating the medication in question is Dexamethasone 2 mg and he is taking 1 tablet in the morning and 1 tablet at 1 PM daily

## 2021-07-28 ENCOUNTER — Other Ambulatory Visit: Payer: Self-pay | Admitting: Pharmacist

## 2021-07-28 DIAGNOSIS — C719 Malignant neoplasm of brain, unspecified: Secondary | ICD-10-CM

## 2021-07-28 MED ORDER — TEMOZOLOMIDE 140 MG PO CAPS: 140.0000 mg | ORAL_CAPSULE | Freq: Every day | ORAL | 0 refills | Status: DC

## 2021-07-28 NOTE — Progress Notes (Signed)
Temodar prescription sent to [facility] for discount pricing, patient/patient's wife is aware.

## 2021-07-29 ENCOUNTER — Other Ambulatory Visit: Payer: Self-pay | Admitting: *Deleted

## 2021-07-29 ENCOUNTER — Ambulatory Visit
Admission: RE | Admit: 2021-07-29 | Discharge: 2021-07-29 | Disposition: A | Discharge status: Home / Self Care | Payer: Self-pay | Source: Ambulatory Visit | Attending: Radiation Oncology | Admitting: Radiation Oncology

## 2021-07-29 DIAGNOSIS — Z510 Encounter for antineoplastic radiation therapy: Secondary | ICD-10-CM | POA: Insufficient documentation

## 2021-07-29 DIAGNOSIS — C711 Malignant neoplasm of frontal lobe: Secondary | ICD-10-CM | POA: Insufficient documentation

## 2021-07-29 MED ORDER — DEXAMETHASONE 2 MG PO TABS: 2.0000 mg | ORAL_TABLET | Freq: Two times a day (BID) | ORAL | 0 refills | Status: DC

## 2021-08-01 ENCOUNTER — Other Ambulatory Visit (HOSPITAL_COMMUNITY): Payer: Self-pay

## 2021-08-03 NOTE — Telephone Encounter (Signed)
Called and spoke to patients wife, Joycelyn Schmid, and she called [facility] and verified price for full quantity to get patient through radiation treatment.  Cost is $362.05 and will pick up on 08/04/21.  Joycelyn Schmid will bring medication to appointment on 08/08/21 for education.  [place] Patient [place] Phone [phone] Fax [phone] 08/03/2021 9:50 AM

## 2021-08-08 ENCOUNTER — Ambulatory Visit
Admission: RE | Admit: 2021-08-08 | Discharge: 2021-08-08 | Disposition: A | Discharge status: Home / Self Care | Payer: Self-pay | Source: Ambulatory Visit | Attending: Radiation Oncology | Admitting: Radiation Oncology

## 2021-08-08 ENCOUNTER — Inpatient Hospital Stay: Payer: Self-pay | Admitting: Pharmacist

## 2021-08-08 DIAGNOSIS — C719 Malignant neoplasm of brain, unspecified: Secondary | ICD-10-CM

## 2021-08-08 NOTE — Progress Notes (Signed)
Oral Chemotherapy Clinic [facility]  Telephone:([phone] Fax:([phone]) [phone]  Patient Care Team: Anabel Halon, MD as PCP - General (Internal Medicine)   Name of the patient: Todd [name]  [id number]  1960-10-21   Date of visit: 08/08/21  HPI: Patient is a 61 y.o. male with left frontotemporal glioblastoma. He will start treatment with temozolomide and radiation tomorrow 6/26/223.  Reason for Consult: Temozolamide (Temodar) oral chemotherapy education.   PAST MEDICAL HISTORY: Past Medical History:  Diagnosis Date   Anxiety    CHF (congestive heart failure) (H[facility])    Depression     HEMATOLOGY/ONCOLOGY HISTORY:  Oncology History  Glioblastoma, IDH-wildtype (H[facility])  06/22/2021 Surgery   Left frontotemporal craniotomy, resection by Dr. Lovell Sheehan.  Path is GBM IDHwt.   07/18/2021 -  Chemotherapy   Patient is on Treatment Plan : BRAIN GLIOBLASTOMA Radiation Therapy With Concurrent Temozolomide 75 mg/m2 Daily Followed By Sequential Maintenance Temozolomide x 6-12 cycles       ALLERGIES:  has No Known Allergies.  MEDICATIONS:  Current Outpatient Medications  Medication Sig Dispense Refill   Chlorpheniramine-DM (COUGH & COLD PO) Take 5 mLs by mouth every 6 (six) hours as needed (cough). (Patient not taking: Reported on 07/07/2021)     dexamethasone (DECADRON) 2 MG tablet Take 1 tablet (2 mg total) by mouth 2 (two) times daily with a meal. 100 tablet 0   diphenhydrAMINE HCl, Sleep, (ZZZQUIL PO) Take 30 mLs by mouth daily as needed (takes at bedtime for sleep).     escitalopram (LEXAPRO) 10 MG tablet Take 1 tablet (10 mg total) by mouth daily. 30 tablet 3   HYDROcodone-acetaminophen (NORCO/VICODIN) 5-325 MG tablet Take 1 tablet by mouth every 4 (four) hours as needed for moderate pain. 30 tablet 0   lansoprazole (PREVACID) 30 MG capsule Take 1 capsule (30 mg total) by mouth daily at 12 noon. 30 capsule 3   levETIRAcetam (KEPPRA) 500 MG tablet Take 1 tablet (500  mg total) by mouth 2 (two) times daily. 120 tablet 1   losartan (COZAAR) 25 MG tablet Take 1 tablet (25 mg total) by mouth daily. 90 tablet 1   metoprolol succinate (TOPROL XL) 50 MG 24 hr tablet Take 1 tablet (50 mg total) by mouth daily. Take with or immediately following a meal. 90 tablet 1   Multiple Vitamins-Minerals (COMPLETE MULTIVITAMIN/MINERAL PO) Take by mouth.     ondansetron (ZOFRAN) 8 MG tablet Take 1 tablet (8 mg total) by mouth 2 (two) times daily. 30 tablet 0   Sennosides-Docusate Sodium (STOOL SOFTENER/LAXATIVE PO) Take by mouth.     tamsulosin (FLOMAX) 0.4 MG CAPS capsule Take 1 capsule (0.4 mg total) by mouth daily. 30 capsule 0   temozolomide (TEMODAR) 140 MG capsule Take 1 capsule (140 mg total) by mouth daily. May take on an empty stomach to decrease nausea & vomiting. 42 capsule 0   No current facility-administered medications for this visit.    VITAL SIGNS: There were no vitals taken for this visit. There were no vitals filed for this visit.  Estimated body mass index is 22.4 kg/m as calculated from the following:   Height as of 07/25/21: 5\' 10"  (1.778 m).   Weight as of 07/25/21: 70.8 kg (156 lb 1.6 oz).  LABS: CBC:    Component Value Date/Time   WBC 17.3 (H) 06/23/2021 0150   HGB 12.6 (L) 06/23/2021 0150   HCT 36.9 (L) 06/23/2021 0150   PLT 259 06/23/2021 0150   MCV 93.4 06/23/2021 0150  Comprehensive Metabolic Panel:    Component Value Date/Time   NA 141 06/23/2021 0150   K 4.0 06/23/2021 0150   CL 113 (H) 06/23/2021 0150   CO2 23 06/23/2021 0150   BUN 33 (H) 06/23/2021 0150   CREATININE 1.12 06/23/2021 0150   GLUCOSE 127 (H) 06/23/2021 0150   CALCIUM 8.4 (L) 06/23/2021 0150     Present during today's visit: patient and patient's wife  Start plan: Patient will start Temodar on 08/09/21 with day 1 of radiation.   Patient Education I spoke with patient for overview of new oral chemotherapy medication: Temozolamide (Temodar)     Administration: Counseled patient on administration, dosing, side effects, monitoring, drug-food interactions, safe handling, storage, and disposal. Patient will take 1 capsule (140 mg total) by mouth daily.  *Patient was instructed to ondansetron as a pre-medication to prevent nausea, 30-60 mins prior to his temozolomide.   Side Effects: Side effects include but not limited to: alopecia, constipation, nausea/vomiting, loss of appetite, headache, fatigue, decreased WBC and platelets.    Drug-drug Interactions (DDI): None identified  Adherence: After discussion with patient no patient barriers to medication adherence identified.  Reviewed with patient importance of keeping a medication schedule and plan for any missed doses.  The [facility] voiced understanding and appreciation. All questions answered. Medication handout provided.  Provided patient with Oral Chemotherapy Navigation Clinic phone number. Patient knows to call the office with questions or concerns. Oral Chemotherapy Navigation Clinic will continue to follow.  Patient expressed understanding and was in agreement with this plan. He also understands that He can call clinic at any time with any questions, concerns, or complaints.   Medication Access Issues: None identified  Thank you for allowing me to participate in the care of this patient.   Time Total: 20 minutes  Visit consisted of counseling and education on dealing with issues of symptom management in the setting of serious and potentially life-threatening illness.Greater than 50%  of this time was spent counseling and coordinating care related to the above assessment and plan.  Signed by: Remi Haggard, PharmD, BCPS, Nolon Bussing, CPP Hematology/Oncology Clinical Pharmacist Practitioner [place]/DB/AP Oral Chemotherapy Navigation Clinic [phone]  08/08/2021 3:33 PM

## 2021-08-09 ENCOUNTER — Other Ambulatory Visit: Payer: Self-pay

## 2021-08-09 ENCOUNTER — Ambulatory Visit
Admission: RE | Admit: 2021-08-09 | Discharge: 2021-08-09 | Disposition: A | Discharge status: Home / Self Care | Payer: Self-pay | Source: Ambulatory Visit | Attending: Radiation Oncology | Admitting: Radiation Oncology

## 2021-08-09 LAB — RAD ONC ARIA SESSION SUMMARY
Course Elapsed Days: 0
Plan Fractions Treated to Date: 1
Plan Prescribed Dose Per Fraction: 2 Gy
Plan Total Fractions Prescribed: 30
Plan Total Prescribed Dose: 60 Gy
Reference Point Dosage Given to Date: 2 Gy
Reference Point Session Dosage Given: 2 Gy
Session Number: 1

## 2021-08-10 ENCOUNTER — Ambulatory Visit
Admission: RE | Admit: 2021-08-10 | Discharge: 2021-08-10 | Disposition: A | Discharge status: Home / Self Care | Payer: Self-pay | Source: Ambulatory Visit | Attending: Radiation Oncology | Admitting: Radiation Oncology

## 2021-08-10 ENCOUNTER — Other Ambulatory Visit: Payer: Self-pay

## 2021-08-10 LAB — RAD ONC ARIA SESSION SUMMARY
Course Elapsed Days: 1
Plan Fractions Treated to Date: 2
Plan Prescribed Dose Per Fraction: 2 Gy
Plan Total Fractions Prescribed: 30
Plan Total Prescribed Dose: 60 Gy
Reference Point Dosage Given to Date: 4 Gy
Reference Point Session Dosage Given: 2 Gy
Session Number: 2

## 2021-08-11 ENCOUNTER — Ambulatory Visit
Admission: RE | Admit: 2021-08-11 | Discharge: 2021-08-11 | Disposition: A | Discharge status: Home / Self Care | Payer: Self-pay | Source: Ambulatory Visit | Attending: Radiation Oncology | Admitting: Radiation Oncology

## 2021-08-11 ENCOUNTER — Other Ambulatory Visit: Payer: Self-pay

## 2021-08-11 LAB — RAD ONC ARIA SESSION SUMMARY
Course Elapsed Days: 2
Plan Fractions Treated to Date: 3
Plan Prescribed Dose Per Fraction: 2 Gy
Plan Total Fractions Prescribed: 30
Plan Total Prescribed Dose: 60 Gy
Reference Point Dosage Given to Date: 6 Gy
Reference Point Session Dosage Given: 2 Gy
Session Number: 3

## 2021-08-12 ENCOUNTER — Encounter: Payer: Self-pay | Admitting: *Deleted

## 2021-08-12 ENCOUNTER — Other Ambulatory Visit: Payer: Self-pay

## 2021-08-12 ENCOUNTER — Ambulatory Visit
Admission: RE | Admit: 2021-08-12 | Discharge: 2021-08-12 | Disposition: A | Discharge status: Home / Self Care | Payer: Self-pay | Source: Ambulatory Visit | Attending: Radiation Oncology | Admitting: Radiation Oncology

## 2021-08-12 LAB — RAD ONC ARIA SESSION SUMMARY
Course Elapsed Days: 3
Plan Fractions Treated to Date: 4
Plan Prescribed Dose Per Fraction: 2 Gy
Plan Total Fractions Prescribed: 30
Plan Total Prescribed Dose: 60 Gy
Reference Point Dosage Given to Date: 8 Gy
Reference Point Session Dosage Given: 2 Gy
Session Number: 4

## 2021-08-12 NOTE — Progress Notes (Signed)
Patient experiencing mild confusion, tremors in the past few days.  Per Dr. Baruch Gouty increase decadron to '4mg'$  BID.   Instructions given to wife, she verbalized understanding.

## 2021-08-15 ENCOUNTER — Ambulatory Visit
Admission: RE | Admit: 2021-08-15 | Discharge: 2021-08-15 | Disposition: A | Discharge status: Home / Self Care | Payer: BLUE CROSS/BLUE SHIELD | Source: Ambulatory Visit | Attending: Radiation Oncology | Admitting: Radiation Oncology

## 2021-08-15 ENCOUNTER — Encounter: Payer: Self-pay | Admitting: Internal Medicine

## 2021-08-15 ENCOUNTER — Other Ambulatory Visit: Payer: Self-pay

## 2021-08-15 DIAGNOSIS — C711 Malignant neoplasm of frontal lobe: Secondary | ICD-10-CM | POA: Insufficient documentation

## 2021-08-15 DIAGNOSIS — Z510 Encounter for antineoplastic radiation therapy: Secondary | ICD-10-CM | POA: Insufficient documentation

## 2021-08-15 DIAGNOSIS — R519 Headache, unspecified: Secondary | ICD-10-CM | POA: Diagnosis not present

## 2021-08-15 DIAGNOSIS — R479 Unspecified speech disturbances: Secondary | ICD-10-CM | POA: Diagnosis not present

## 2021-08-15 LAB — RAD ONC ARIA SESSION SUMMARY
Course Elapsed Days: 6
Plan Fractions Treated to Date: 5
Plan Prescribed Dose Per Fraction: 2 Gy
Plan Total Fractions Prescribed: 30
Plan Total Prescribed Dose: 60 Gy
Reference Point Dosage Given to Date: 10 Gy
Reference Point Session Dosage Given: 2 Gy
Session Number: 5

## 2021-08-17 ENCOUNTER — Other Ambulatory Visit: Payer: Self-pay

## 2021-08-17 ENCOUNTER — Ambulatory Visit
Admission: RE | Admit: 2021-08-17 | Discharge: 2021-08-17 | Disposition: A | Discharge status: Home / Self Care | Payer: BLUE CROSS/BLUE SHIELD | Source: Ambulatory Visit | Attending: Radiation Oncology | Admitting: Radiation Oncology

## 2021-08-17 DIAGNOSIS — Z510 Encounter for antineoplastic radiation therapy: Secondary | ICD-10-CM | POA: Diagnosis not present

## 2021-08-17 LAB — RAD ONC ARIA SESSION SUMMARY
Course Elapsed Days: 8
Plan Fractions Treated to Date: 6
Plan Prescribed Dose Per Fraction: 2 Gy
Plan Total Fractions Prescribed: 30
Plan Total Prescribed Dose: 60 Gy
Reference Point Dosage Given to Date: 12 Gy
Reference Point Session Dosage Given: 2 Gy
Session Number: 6

## 2021-08-18 ENCOUNTER — Ambulatory Visit
Admission: RE | Admit: 2021-08-18 | Discharge: 2021-08-18 | Disposition: A | Discharge status: Home / Self Care | Payer: BLUE CROSS/BLUE SHIELD | Source: Ambulatory Visit | Attending: Radiation Oncology | Admitting: Radiation Oncology

## 2021-08-18 ENCOUNTER — Other Ambulatory Visit: Payer: Self-pay

## 2021-08-18 DIAGNOSIS — Z510 Encounter for antineoplastic radiation therapy: Secondary | ICD-10-CM | POA: Diagnosis not present

## 2021-08-18 LAB — RAD ONC ARIA SESSION SUMMARY
Course Elapsed Days: 9
Plan Fractions Treated to Date: 7
Plan Prescribed Dose Per Fraction: 2 Gy
Plan Total Fractions Prescribed: 30
Plan Total Prescribed Dose: 60 Gy
Reference Point Dosage Given to Date: 14 Gy
Reference Point Session Dosage Given: 2 Gy
Session Number: 7

## 2021-08-19 ENCOUNTER — Other Ambulatory Visit: Payer: Self-pay

## 2021-08-19 ENCOUNTER — Inpatient Hospital Stay: Payer: BLUE CROSS/BLUE SHIELD | Attending: Internal Medicine

## 2021-08-19 ENCOUNTER — Ambulatory Visit
Admission: RE | Admit: 2021-08-19 | Discharge: 2021-08-19 | Disposition: A | Discharge status: Home / Self Care | Payer: BLUE CROSS/BLUE SHIELD | Source: Ambulatory Visit | Attending: Radiation Oncology | Admitting: Radiation Oncology

## 2021-08-19 ENCOUNTER — Inpatient Hospital Stay: Payer: BLUE CROSS/BLUE SHIELD | Admitting: Internal Medicine

## 2021-08-19 VITALS — BP 120/79 | HR 67 | Temp 97.2°F | Resp 16 | Wt 161.0 lb

## 2021-08-19 DIAGNOSIS — R519 Headache, unspecified: Secondary | ICD-10-CM | POA: Insufficient documentation

## 2021-08-19 DIAGNOSIS — Z510 Encounter for antineoplastic radiation therapy: Secondary | ICD-10-CM | POA: Diagnosis not present

## 2021-08-19 DIAGNOSIS — C711 Malignant neoplasm of frontal lobe: Secondary | ICD-10-CM | POA: Insufficient documentation

## 2021-08-19 DIAGNOSIS — R479 Unspecified speech disturbances: Secondary | ICD-10-CM | POA: Insufficient documentation

## 2021-08-19 DIAGNOSIS — C719 Malignant neoplasm of brain, unspecified: Secondary | ICD-10-CM

## 2021-08-19 LAB — CBC WITH DIFFERENTIAL/PLATELET
Abs Immature Granulocytes: 0.13 10*3/uL — ABNORMAL HIGH (ref 0.00–0.07)
Basophils Absolute: 0 10*3/uL (ref 0.0–0.1)
Basophils Relative: 0 %
Eosinophils Absolute: 0.1 10*3/uL (ref 0.0–0.5)
Eosinophils Relative: 1 %
HCT: 39.9 % (ref 39.0–52.0)
Hemoglobin: 12.8 g/dL — ABNORMAL LOW (ref 13.0–17.0)
Immature Granulocytes: 1 %
Lymphocytes Relative: 9 %
Lymphs Abs: 1.1 10*3/uL (ref 0.7–4.0)
MCH: 30.5 pg (ref 26.0–34.0)
MCHC: 32.1 g/dL (ref 30.0–36.0)
MCV: 95 fL (ref 80.0–100.0)
Monocytes Absolute: 1.1 10*3/uL — ABNORMAL HIGH (ref 0.1–1.0)
Monocytes Relative: 8 %
Neutro Abs: 10.5 10*3/uL — ABNORMAL HIGH (ref 1.7–7.7)
Neutrophils Relative %: 81 %
Platelets: 253 10*3/uL (ref 150–400)
RBC: 4.2 MIL/uL — ABNORMAL LOW (ref 4.22–5.81)
RDW: 13.1 % (ref 11.5–15.5)
WBC: 12.9 10*3/uL — ABNORMAL HIGH (ref 4.0–10.5)
nRBC: 0 % (ref 0.0–0.2)

## 2021-08-19 LAB — COMPREHENSIVE METABOLIC PANEL
ALT: 17 U/L (ref 0–44)
AST: 14 U/L — ABNORMAL LOW (ref 15–41)
Albumin: 3.6 g/dL (ref 3.5–5.0)
Alkaline Phosphatase: 41 U/L (ref 38–126)
Anion gap: 6 (ref 5–15)
BUN: 23 mg/dL — ABNORMAL HIGH (ref 6–20)
CO2: 30 mmol/L (ref 22–32)
Calcium: 8.4 mg/dL — ABNORMAL LOW (ref 8.9–10.3)
Chloride: 99 mmol/L (ref 98–111)
Creatinine, Ser: 0.86 mg/dL (ref 0.61–1.24)
GFR, Estimated: >60 mL/min (ref >60)
Glucose, Bld: 98 mg/dL (ref 70–99)
Potassium: 3.9 mmol/L (ref 3.5–5.1)
Sodium: 135 mmol/L (ref 135–145)
Total Bilirubin: 0.7 mg/dL (ref 0.3–1.2)
Total Protein: 6.1 g/dL — ABNORMAL LOW (ref 6.5–8.1)

## 2021-08-19 LAB — RAD ONC ARIA SESSION SUMMARY
Course Elapsed Days: 10
Plan Fractions Treated to Date: 8
Plan Prescribed Dose Per Fraction: 2 Gy
Plan Total Fractions Prescribed: 30
Plan Total Prescribed Dose: 60 Gy
Reference Point Dosage Given to Date: 16 Gy
Reference Point Session Dosage Given: 2 Gy
Session Number: 8

## 2021-08-19 MED ORDER — DEXAMETHASONE 2 MG PO TABS: 2.0000 mg | ORAL_TABLET | Freq: Every day | ORAL | 0 refills | Status: DC

## 2021-08-19 NOTE — Progress Notes (Signed)
[facility] [place], [place] [address] [phone]   Interval Evaluation  Date of Service: 08/19/21 Patient Name: Todd [name] Patient MRN: [id number] Patient DOB: [birth year] Provider: Ventura Sellers, MD  Identifying Statement:  Todd [name] is a 61 y.o. male with left frontal glioblastoma   Oncologic History: Oncology History  Glioblastoma, IDH-wildtype ([place])  06/22/2021 Surgery   Left frontotemporal craniotomy, resection by Dr. Arnoldo Morale.  Path is GBM IDHwt.   07/18/2021 -  Chemotherapy   Patient is on Treatment Plan : BRAIN GLIOBLASTOMA Radiation Therapy With Concurrent Temozolomide 75 mg/m2 Daily Followed By Sequential Maintenance Temozolomide x 6-12 cycles       Biomarkers:  MGMT Unknown.  IDH 1/2 Wild type.  EGFR Unknown  TERT Unknown   Interval History: Todd [name] presents today for follow up, now having completed 2 weeks of radiation and Temodar.  He is doing well with therapy so far, no significant issues.  He has been active mowing his lawn, outside often.  Not needing the walker.  Currently taking decadron 29m twice per day.  H+P (07/18/21) Patient presented to medical attention last month with several weeks of progressive confusion, balance problems, headaches.  In addition, he had at least one full convulsion/seizure event.  [facility] enhancing left frontal and temporal mass lesion.  He underwent craniotomy and debulking resection with Dr. JArnoldo Moraleon 06/22/21; path demonstrated glioblastoma.  Following surgery, he did not experiencing worsening of weakness, gait, or cognition.  Headaches have persisted however, near daily.  No seizure events since prior to surgery.  Currently living at home with his wife, walking independently, and taking care of own activities of daily living.  He has a chronic speech impediment which is unchanged from prior baseline.  Decadron was  dosed at 449mTID and stopped abruptly ~5 days ago without taper.  Medications: Current Outpatient Medications on File Prior to Visit  Medication Sig Dispense Refill   Chlorpheniramine-DM (COUGH & COLD PO) Take 5 mLs by mouth every 6 (six) hours as needed (cough). (Patient not taking: Reported on 07/07/2021)     dexamethasone (DECADRON) 2 MG tablet Take 1 tablet (2 mg total) by mouth 2 (two) times daily with a meal. 100 tablet 0   diphenhydrAMINE HCl, Sleep, (ZZZQUIL PO) Take 30 mLs by mouth daily as needed (takes at bedtime for sleep).     escitalopram (LEXAPRO) 10 MG tablet Take 1 tablet (10 mg total) by mouth daily. 30 tablet 3   HYDROcodone-acetaminophen (NORCO/VICODIN) 5-325 MG tablet Take 1 tablet by mouth every 4 (four) hours as needed for moderate pain. 30 tablet 0   lansoprazole (PREVACID) 30 MG capsule Take 1 capsule (30 mg total) by mouth daily at 12 noon. 30 capsule 3   levETIRAcetam (KEPPRA) 500 MG tablet Take 1 tablet (500 mg total) by mouth 2 (two) times daily. 120 tablet 1   losartan (COZAAR) 25 MG tablet Take 1 tablet (25 mg total) by mouth daily. 90 tablet 1   metoprolol succinate (TOPROL XL) 50 MG 24 hr tablet Take 1 tablet (50 mg total) by mouth daily. Take with or immediately following a meal. 90 tablet 1   Multiple Vitamins-Minerals (COMPLETE MULTIVITAMIN/MINERAL PO) Take by mouth.     ondansetron (ZOFRAN) 8 MG tablet Take 1 tablet (8 mg total) by mouth 2 (two) times daily. 30 tablet 0   Sennosides-Docusate Sodium (STOOL SOFTENER/LAXATIVE PO) Take by mouth.  tamsulosin (FLOMAX) 0.4 MG CAPS capsule Take 1 capsule (0.4 mg total) by mouth daily. 30 capsule 0   temozolomide (TEMODAR) 140 MG capsule Take 1 capsule (140 mg total) by mouth daily. May take on an empty stomach to decrease nausea & vomiting. 42 capsule 0   No current facility-administered medications on file prior to visit.    Allergies: No Known Allergies Past Medical History:  Past Medical History:  Diagnosis  Date   Anxiety    CHF (congestive heart failure) ([place])    Depression    Past Surgical History:  Past Surgical History:  Procedure Laterality Date   APPLICATION OF CRANIAL NAVIGATION Left 06/22/2021   Procedure: APPLICATION OF CRANIAL NAVIGATION;  Surgeon: Newman Pies, MD;  Location: [facility];  Service: Neurosurgery;  Laterality: Left;   CRANIOTOMY Left 06/22/2021   Procedure: LEFT CRANIOTOMY FOR TUMOR EXCISION;  Surgeon: Newman Pies, MD;  Location: [facility];  Service: Neurosurgery;  Laterality: Left;   RADIOLOGY WITH ANESTHESIA N/A 06/22/2021   Procedure: MRI BRAIN  WITH ANESTHESIA;  Surgeon: Radiologist, Medication, MD;  Location: [facility];  Service: Radiology;  Laterality: N/A;   Social History:  Social History   Socioeconomic History   Marital status: Married    Spouse name: Not on file   Number of children: Not on file   Years of education: Not on file   Highest education level: Not on file  Occupational History   Not on file  Tobacco Use   Smoking status: Former   Smokeless tobacco: Never  Vaping Use   Vaping Use: Every day   Substances: Nicotine, Flavoring  Substance and Sexual Activity   Alcohol use: Never   Drug use: Never   Sexual activity: Yes    Birth control/protection: None  Other Topics Concern   Not on file  Social History Narrative   Not on file   Social Determinants of Health   Financial Resource Strain: Low Risk  (07/19/2021)   Overall Financial Resource Strain (CARDIA)    Difficulty of Paying Living Expenses: Not hard at all  Food Insecurity: No Food Insecurity (07/19/2021)   Hunger Vital Sign    Worried About Running Out of Food in the Last Year: Never true    [place] in the Last Year: Never true  Transportation Needs: No Transportation Needs (07/19/2021)   PRAPARE - Hydrologist (Medical): No    Lack of Transportation (Non-Medical): No  Physical Activity: Not on file  Stress: Not on file  Social Connections: Not  on file  Intimate Partner Violence: Not on file   Family History:  Family History  Problem Relation Age of Onset   Cancer Mother    Dementia Father     Review of Systems: Constitutional: Doesn't report fevers, chills or abnormal weight loss Eyes: Doesn't report blurriness of vision Ears, nose, mouth, throat, and face: Doesn't report sore throat Respiratory: Doesn't report cough, dyspnea or wheezes Cardiovascular: Doesn't report palpitation, chest discomfort  Gastrointestinal:  Doesn't report nausea, constipation, diarrhea GU: Doesn't report incontinence Skin: Doesn't report skin rashes Neurological: Per HPI Musculoskeletal: Doesn't report joint pain Behavioral/Psych: Doesn't report anxiety  Physical Exam: Vitals:   08/19/21 0938  BP: 120/79  Pulse: 67  Resp: 16  Temp: (!) 97.2 F (36.2 C)  SpO2: 100%    KPS: 80. General: Alert, cooperative, pleasant, in no acute distress Head: Normal EENT: No conjunctival injection or scleral icterus.  Lungs: Resp effort normal Cardiac: Regular  rate Abdomen: Non-distended abdomen Skin: No rashes cyanosis or petechiae. Extremities: No clubbing or edema  Neurologic Exam: Mental Status: Awake, alert, attentive to examiner. Oriented to self and environment. Language is noted for mild dysphasia, chronic disarticulation and stutter. Cranial Nerves: Visual acuity is grossly normal. Visual fields are full. Extra-ocular movements intact. No ptosis. Face is symmetric Motor: Tone and bulk are normal. Power is full in both arms and legs. Reflexes are symmetric, no pathologic reflexes present.  Sensory: Intact to light touch Gait: Normal.   Labs: I have reviewed the data as listed    Component Value Date/Time   NA 135 08/19/2021 0919   K 3.9 08/19/2021 0919   CL 99 08/19/2021 0919   CO2 30 08/19/2021 0919   GLUCOSE 98 08/19/2021 0919   BUN 23 (H) 08/19/2021 0919   CREATININE 0.86 08/19/2021 0919   CALCIUM 8.4 (L) 08/19/2021 0919    PROT 6.1 (L) 08/19/2021 0919   ALBUMIN 3.6 08/19/2021 0919   AST 14 (L) 08/19/2021 0919   ALT 17 08/19/2021 0919   ALKPHOS 41 08/19/2021 0919   BILITOT 0.7 08/19/2021 0919   GFRNONAA >60 08/19/2021 0919   Lab Results  Component Value Date   WBC 17.3 (H) 06/23/2021   HGB 12.6 (L) 06/23/2021   HCT 36.9 (L) 06/23/2021   MCV 93.4 06/23/2021   PLT 259 06/23/2021    Imaging:  MR BRAIN W WO CONTRAST  Result Date: 07/22/2021 CLINICAL DATA:  Brain/CNS neoplasm, assess treatment response; glioblastoma post craniotomy May 2023 EXAM: MRI HEAD WITHOUT AND WITH CONTRAST TECHNIQUE: Multiplanar, multiecho pulse sequences of the brain and surrounding structures were obtained without and with intravenous contrast. CONTRAST:  90m GADAVIST GADOBUTROL 1 MMOL/ML IV SOLN COMPARISON:  06/22/2021 FINDINGS: Brain: Interval postoperative changes with significant debulking of left frontotemporal mass. There is no immediate postoperative study for comparison. A left temporal near CSF intensity resection cavity is present along the anterior left temporal lobe. Enhancement is present along the resection cavity margins. Given irregular appearance of the preoperative enhancement, residual tumor is difficult to exclude. Substantially decreased edema. Infiltrating T2 hyperintense tumor remains present deep and superior to the resection cavity including involvement of the uncus, hippocampus, basal ganglia, and insula. Mass effect has substantially improved with essentially resolved midline shift. There remains flattening of the left cerebral peduncle by the expansile medial temporal tumor. No acute infarction or hydrocephalus. Vascular: Major vessel flow voids at the skull base are preserved. Skull and upper cervical spine: Normal marrow signal is preserved. Sinuses/Orbits: Paranasal sinuses are aerated. Orbits are unremarkable. Other: Sella is unremarkable.  Mastoid air cells are clear. IMPRESSION: No immediate postoperative  studies available for comparison. Gross total resection of the enhancing component of the left frontotemporal mass. Difficult to exclude some residual tumor given irregular appearance of the preoperative enhancement. Edema has substantially improved. Remains surrounding nonenhancing infiltrating tumor including expansion of the medial temporal lobe with flattening of the left cerebral peduncle. Electronically Signed   By: PMacy MisM.D.   On: 07/22/2021 10:51     Assessment/Plan Glioblastoma, IDH-wildtype [place] [facility] [place]  SEdson Deridderis clinically stable today, now having completed 2 weeks of IMRT and Temozolomide.  Labs are within normal limits.  We ultimately recommended continuing with course of intensity modulated radiation therapy and concurrent daily Temozolomide.  Radiation will be administered Mon-Fri over 6 weeks, Temodar will be dosed at 761mm2 to be given daily over 42 days.  We reviewed side effects of temodar, including fatigue, nausea/vomiting, constipation,  and cytopenias.  Informed consent was verbally obtained at bedside to proceed with oral chemotherapy.  Chemotherapy should be held for the following:  ANC less than 1,000  Platelets less than 100,000  LFT or creatinine greater than 2x ULN  If clinical concerns/contraindications develop  Every 2 weeks during radiation, labs will be checked accompanied by a clinical evaluation in the brain tumor clinic.  Decadron should be decreased to 78m daily x5 days, then 29mdaily thereafter.  Keppra should continue 50054mID for now.  All questions were answered. The patient knows to call the clinic with any problems, questions or concerns. No barriers to learning were detected.  The total time spent in the encounter was 30 minutes and more than 50% was on counseling and review of test results   ZacVentura SellersD Medical Director of Neuro-Oncology [facility] [place]/07/23 9:26 AM

## 2021-08-19 NOTE — Progress Notes (Signed)
Pt returns for follow-up. He states that he is doing better overall; mowing grass and resuming some of his normal activities.

## 2021-08-22 ENCOUNTER — Other Ambulatory Visit: Payer: Self-pay

## 2021-08-22 ENCOUNTER — Ambulatory Visit: Payer: BLUE CROSS/BLUE SHIELD | Admitting: Internal Medicine

## 2021-08-22 ENCOUNTER — Ambulatory Visit
Admission: RE | Admit: 2021-08-22 | Discharge: 2021-08-22 | Disposition: A | Discharge status: Home / Self Care | Payer: BLUE CROSS/BLUE SHIELD | Source: Ambulatory Visit | Attending: Radiation Oncology | Admitting: Radiation Oncology

## 2021-08-22 ENCOUNTER — Encounter: Payer: Self-pay | Admitting: Internal Medicine

## 2021-08-22 VITALS — BP 100/60 | HR 105 | Ht 70.0 in | Wt 165.0 lb

## 2021-08-22 DIAGNOSIS — I509 Heart failure, unspecified: Secondary | ICD-10-CM | POA: Diagnosis not present

## 2021-08-22 DIAGNOSIS — Z510 Encounter for antineoplastic radiation therapy: Secondary | ICD-10-CM | POA: Diagnosis not present

## 2021-08-22 LAB — RAD ONC ARIA SESSION SUMMARY
Course Elapsed Days: 13
Plan Fractions Treated to Date: 9
Plan Prescribed Dose Per Fraction: 2 Gy
Plan Total Fractions Prescribed: 30
Plan Total Prescribed Dose: 60 Gy
Reference Point Dosage Given to Date: 18 Gy
Reference Point Session Dosage Given: 2 Gy
Session Number: 9

## 2021-08-22 NOTE — Progress Notes (Signed)
Cardiology Office Note   Date:  08/22/2021   ID:  Todd [name], DOB 05-07-1960, MRN [id number]  PCP:  Lindell Spar, MD  Cardiologist:   Dorris Carnes, MD   Pt referred for evaluation of CHF      History of Present Illness: Todd [name] is a 61 y.o. male with a history of glioblastoma   Currently undergoing treatment (surgery, radiation Rx)    He was referred for evaluation of CHF    The pt previously lived in [place]   There, in about 2020 he was diagnosed with heart failure.  (Dr Louis Meckel)  No records at this time   Told it was due to alcohol.  The pt moved to [facility]    Follows with Dr Posey Pronto.   REcently diagnosed with glioblastoma.  Undergoing therapy for this   He denies CP  Breathing is OK   Is a little fatigued which he feels may be due to treatment.       Current Meds  Medication Sig   dexamethasone (DECADRON) 2 MG tablet Take 1 tablet (2 mg total) by mouth daily.   diphenhydrAMINE HCl, Sleep, (ZZZQUIL PO) Take 30 mLs by mouth daily as needed (takes at bedtime for sleep).   escitalopram (LEXAPRO) 10 MG tablet Take 1 tablet (10 mg total) by mouth daily.   lansoprazole (PREVACID) 30 MG capsule Take 1 capsule (30 mg total) by mouth daily at 12 noon.   levETIRAcetam (KEPPRA) 500 MG tablet Take 1 tablet (500 mg total) by mouth 2 (two) times daily.   losartan (COZAAR) 25 MG tablet Take 1 tablet (25 mg total) by mouth daily.   metoprolol succinate (TOPROL XL) 50 MG 24 hr tablet Take 1 tablet (50 mg total) by mouth daily. Take with or immediately following a meal.   Multiple Vitamins-Minerals (COMPLETE MULTIVITAMIN/MINERAL PO) Take by mouth.   ondansetron (ZOFRAN) 8 MG tablet Take 1 tablet (8 mg total) by mouth 2 (two) times daily.   Sennosides-Docusate Sodium (STOOL SOFTENER/LAXATIVE PO) Take by mouth.   temozolomide (TEMODAR) 140 MG capsule Take 1 capsule (140 mg total) by mouth daily. May take on an empty stomach to decrease nausea & vomiting.     Allergies:   Patient has  no known allergies.   Past Medical History:  Diagnosis Date   Anxiety    CHF (congestive heart failure) ([place])    Depression     Past Surgical History:  Procedure Laterality Date   APPLICATION OF CRANIAL NAVIGATION Left 06/22/2021   Procedure: APPLICATION OF CRANIAL NAVIGATION;  Surgeon: Newman Pies, MD;  Location: [facility];  Service: Neurosurgery;  Laterality: Left;   CRANIOTOMY Left 06/22/2021   Procedure: LEFT CRANIOTOMY FOR TUMOR EXCISION;  Surgeon: Newman Pies, MD;  Location: [facility];  Service: Neurosurgery;  Laterality: Left;   RADIOLOGY WITH ANESTHESIA N/A 06/22/2021   Procedure: MRI BRAIN  WITH ANESTHESIA;  Surgeon: Radiologist, Medication, MD;  Location: [facility];  Service: Radiology;  Laterality: N/A;     Social History:  The patient  reports that he has quit smoking. His smoking use included e-cigarettes. He started smoking about 3 years ago. He has never used smokeless tobacco. He reports that he does not drink alcohol and does not use drugs.   Family History:  The patient's family history includes Cancer in his mother; Dementia in his father.    ROS:  Please see the history of present illness. All other systems are reviewed and  Negative to the above problem except  as noted.    PHYSICAL EXAM: VS:  BP 100/60 (BP Location: Left Arm, Patient Position: Sitting, Cuff Size: Normal)   Pulse (!) 105   Ht '5\' 10"'$  (1.778 m)   Wt 165 lb (74.8 kg)   SpO2 98%   BMI 23.68 kg/m   GEN: Well nourished, well developed, in no acute distress  HEENT: normal  Neck: no JVD, no carotid bruits Cardiac: RRR; no murmurs,,no LE  edema  Respiratory:  clear to auscultation bilaterally GI: soft, nontender, nondistended, + BS  No hepatomegaly  MS: no deformity Moving all extremities   Skin: warm and dry, no rash Neuro:  Strength and sensation are intact Psych: euthymic mood, full affect   EKG:  EKG is ordered today.  On 06/20/21  NSR   Occasional PAC  78 bpm     Lipid Panel No results  found for: "CHOL", "TRIG", "HDL", "CHOLHDL", "VLDL", "LDLCALC", "LDLDIRECT"    Wt Readings from Last 3 Encounters:  08/22/21 165 lb (74.8 kg)  08/19/21 161 lb (73 kg)  07/25/21 156 lb 1.6 oz (70.8 kg)      ASSESSMENT AND PLAN:  1  Hx CHF.   Pt with dx of HFrEF  No records at present    Will get records from [place].    Today he appears euvolemic       BP is controlled Would like to get an echo to establish baseline now that he lives in [place]  Keep on metoprolol and losartan  2  Hx glioblastoma  s/p Surgery.   Undergoing radiation therapy now.    Tentative follow up in 8 months    Current medicines are reviewed at length with the patient today.  The patient does not have concerns regarding medicines.  Signed, Dorris Carnes, MD  08/22/2021 2:40 PM    [facility] [address], [place], [place]  [address] Phone: [phone]; Fax: [phone]

## 2021-08-22 NOTE — Patient Instructions (Signed)
Medication Instructions:  Your physician recommends that you continue on your current medications as directed. Please refer to the Current Medication list given to you today.   Labwork: None  Testing/Procedures: Your physician has requested that you have an echocardiogram. Echocardiography is a painless test that uses sound waves to create images of your heart. It provides your doctor with information about the size and shape of your heart and how well your heart's chambers and valves are working. This procedure takes approximately one hour. There are no restrictions for this procedure.   Follow-Up: Follow up with Dr. Harrington Challenger in 8 months.   Any Other Special Instructions Will Be Listed Below (If Applicable).     If you need a refill on your cardiac medications before your next appointment, please call your pharmacy.

## 2021-08-23 ENCOUNTER — Other Ambulatory Visit: Payer: Self-pay

## 2021-08-23 ENCOUNTER — Ambulatory Visit
Admission: RE | Admit: 2021-08-23 | Discharge: 2021-08-23 | Disposition: A | Discharge status: Home / Self Care | Payer: BLUE CROSS/BLUE SHIELD | Source: Ambulatory Visit | Attending: Radiation Oncology | Admitting: Radiation Oncology

## 2021-08-23 DIAGNOSIS — Z510 Encounter for antineoplastic radiation therapy: Secondary | ICD-10-CM | POA: Diagnosis not present

## 2021-08-23 LAB — RAD ONC ARIA SESSION SUMMARY
Course Elapsed Days: 14
Plan Fractions Treated to Date: 10
Plan Prescribed Dose Per Fraction: 2 Gy
Plan Total Fractions Prescribed: 30
Plan Total Prescribed Dose: 60 Gy
Reference Point Dosage Given to Date: 20 Gy
Reference Point Session Dosage Given: 2 Gy
Session Number: 10

## 2021-08-24 ENCOUNTER — Other Ambulatory Visit: Payer: Self-pay

## 2021-08-24 ENCOUNTER — Ambulatory Visit
Admission: RE | Admit: 2021-08-24 | Discharge: 2021-08-24 | Disposition: A | Discharge status: Home / Self Care | Payer: BLUE CROSS/BLUE SHIELD | Source: Ambulatory Visit | Attending: Radiation Oncology | Admitting: Radiation Oncology

## 2021-08-24 DIAGNOSIS — Z510 Encounter for antineoplastic radiation therapy: Secondary | ICD-10-CM | POA: Diagnosis not present

## 2021-08-24 LAB — RAD ONC ARIA SESSION SUMMARY
Course Elapsed Days: 15
Plan Fractions Treated to Date: 11
Plan Prescribed Dose Per Fraction: 2 Gy
Plan Total Fractions Prescribed: 30
Plan Total Prescribed Dose: 60 Gy
Reference Point Dosage Given to Date: 22 Gy
Reference Point Session Dosage Given: 2 Gy
Session Number: 11

## 2021-08-25 ENCOUNTER — Other Ambulatory Visit: Payer: Self-pay

## 2021-08-25 ENCOUNTER — Ambulatory Visit
Admission: RE | Admit: 2021-08-25 | Discharge: 2021-08-25 | Disposition: A | Discharge status: Home / Self Care | Payer: BLUE CROSS/BLUE SHIELD | Source: Ambulatory Visit | Attending: Radiation Oncology | Admitting: Radiation Oncology

## 2021-08-25 DIAGNOSIS — Z510 Encounter for antineoplastic radiation therapy: Secondary | ICD-10-CM | POA: Diagnosis not present

## 2021-08-25 LAB — RAD ONC ARIA SESSION SUMMARY
Course Elapsed Days: 16
Plan Fractions Treated to Date: 12
Plan Prescribed Dose Per Fraction: 2 Gy
Plan Total Fractions Prescribed: 30
Plan Total Prescribed Dose: 60 Gy
Reference Point Dosage Given to Date: 24 Gy
Reference Point Session Dosage Given: 2 Gy
Session Number: 12

## 2021-08-26 ENCOUNTER — Ambulatory Visit
Admission: RE | Admit: 2021-08-26 | Discharge: 2021-08-26 | Disposition: A | Discharge status: Home / Self Care | Payer: BLUE CROSS/BLUE SHIELD | Source: Ambulatory Visit | Attending: Radiation Oncology | Admitting: Radiation Oncology

## 2021-08-26 ENCOUNTER — Other Ambulatory Visit: Payer: Self-pay

## 2021-08-26 DIAGNOSIS — Z510 Encounter for antineoplastic radiation therapy: Secondary | ICD-10-CM | POA: Diagnosis not present

## 2021-08-26 LAB — RAD ONC ARIA SESSION SUMMARY
Course Elapsed Days: 17
Plan Fractions Treated to Date: 13
Plan Prescribed Dose Per Fraction: 2 Gy
Plan Total Fractions Prescribed: 30
Plan Total Prescribed Dose: 60 Gy
Reference Point Dosage Given to Date: 26 Gy
Reference Point Session Dosage Given: 2 Gy
Session Number: 13

## 2021-08-29 ENCOUNTER — Ambulatory Visit
Admission: RE | Admit: 2021-08-29 | Discharge: 2021-08-29 | Disposition: A | Discharge status: Home / Self Care | Payer: BLUE CROSS/BLUE SHIELD | Source: Ambulatory Visit | Attending: Radiation Oncology | Admitting: Radiation Oncology

## 2021-08-29 ENCOUNTER — Other Ambulatory Visit: Payer: Self-pay

## 2021-08-29 DIAGNOSIS — Z510 Encounter for antineoplastic radiation therapy: Secondary | ICD-10-CM | POA: Diagnosis not present

## 2021-08-29 LAB — RAD ONC ARIA SESSION SUMMARY
Course Elapsed Days: 20
Plan Fractions Treated to Date: 14
Plan Prescribed Dose Per Fraction: 2 Gy
Plan Total Fractions Prescribed: 30
Plan Total Prescribed Dose: 60 Gy
Reference Point Dosage Given to Date: 28 Gy
Reference Point Session Dosage Given: 2 Gy
Session Number: 14

## 2021-08-30 ENCOUNTER — Ambulatory Visit
Admission: RE | Admit: 2021-08-30 | Discharge: 2021-08-30 | Disposition: A | Discharge status: Home / Self Care | Payer: BLUE CROSS/BLUE SHIELD | Source: Ambulatory Visit | Attending: Radiation Oncology | Admitting: Radiation Oncology

## 2021-08-30 ENCOUNTER — Other Ambulatory Visit: Payer: Self-pay

## 2021-08-30 DIAGNOSIS — Z510 Encounter for antineoplastic radiation therapy: Secondary | ICD-10-CM | POA: Diagnosis not present

## 2021-08-30 LAB — RAD ONC ARIA SESSION SUMMARY
Course Elapsed Days: 21
Plan Fractions Treated to Date: 15
Plan Prescribed Dose Per Fraction: 2 Gy
Plan Total Fractions Prescribed: 30
Plan Total Prescribed Dose: 60 Gy
Reference Point Dosage Given to Date: 30 Gy
Reference Point Session Dosage Given: 2 Gy
Session Number: 15

## 2021-08-31 ENCOUNTER — Other Ambulatory Visit: Payer: Self-pay

## 2021-08-31 ENCOUNTER — Ambulatory Visit (HOSPITAL_COMMUNITY)
Admission: RE | Admit: 2021-08-31 | Discharge: 2021-08-31 | Disposition: A | Discharge status: Home / Self Care | Payer: BLUE CROSS/BLUE SHIELD | Source: Ambulatory Visit | Attending: Internal Medicine | Admitting: Internal Medicine

## 2021-08-31 ENCOUNTER — Ambulatory Visit
Admission: RE | Admit: 2021-08-31 | Discharge: 2021-08-31 | Disposition: A | Discharge status: Home / Self Care | Payer: BLUE CROSS/BLUE SHIELD | Source: Ambulatory Visit | Attending: Radiation Oncology | Admitting: Radiation Oncology

## 2021-08-31 DIAGNOSIS — I509 Heart failure, unspecified: Secondary | ICD-10-CM | POA: Insufficient documentation

## 2021-08-31 LAB — RAD ONC ARIA SESSION SUMMARY
Course Elapsed Days: 22
Plan Fractions Treated to Date: 16
Plan Prescribed Dose Per Fraction: 2 Gy
Plan Total Fractions Prescribed: 30
Plan Total Prescribed Dose: 60 Gy
Reference Point Dosage Given to Date: 32 Gy
Reference Point Session Dosage Given: 2 Gy
Session Number: 16

## 2021-08-31 LAB — ECHOCARDIOGRAM COMPLETE
AR max vel: 2.67 cm2
AV Area VTI: 2.94 cm2
AV Area mean vel: 2.77 cm2
AV Mean grad: 2.9 mmHg
AV Peak grad: 5.6 mmHg
Ao pk vel: 1.19 m/s
Area-P 1/2: 3.53 cm2
S' Lateral: 2.8 cm

## 2021-08-31 NOTE — Progress Notes (Signed)
*  PRELIMINARY RESULTS* Echocardiogram 2D Echocardiogram has been performed.  Todd [name] 08/31/2021, 12:22 PM

## 2021-09-01 ENCOUNTER — Ambulatory Visit
Admission: RE | Admit: 2021-09-01 | Discharge: 2021-09-01 | Disposition: A | Discharge status: Home / Self Care | Payer: BLUE CROSS/BLUE SHIELD | Source: Ambulatory Visit | Attending: Radiation Oncology | Admitting: Radiation Oncology

## 2021-09-01 ENCOUNTER — Other Ambulatory Visit: Payer: Self-pay

## 2021-09-01 DIAGNOSIS — Z510 Encounter for antineoplastic radiation therapy: Secondary | ICD-10-CM | POA: Diagnosis not present

## 2021-09-01 LAB — RAD ONC ARIA SESSION SUMMARY
Course Elapsed Days: 23
Plan Fractions Treated to Date: 17
Plan Prescribed Dose Per Fraction: 2 Gy
Plan Total Fractions Prescribed: 30
Plan Total Prescribed Dose: 60 Gy
Reference Point Dosage Given to Date: 34 Gy
Reference Point Session Dosage Given: 2 Gy
Session Number: 17

## 2021-09-02 ENCOUNTER — Other Ambulatory Visit: Payer: Self-pay

## 2021-09-02 ENCOUNTER — Ambulatory Visit
Admission: RE | Admit: 2021-09-02 | Discharge: 2021-09-02 | Disposition: A | Discharge status: Home / Self Care | Payer: BLUE CROSS/BLUE SHIELD | Source: Ambulatory Visit | Attending: Radiation Oncology | Admitting: Radiation Oncology

## 2021-09-02 DIAGNOSIS — Z510 Encounter for antineoplastic radiation therapy: Secondary | ICD-10-CM | POA: Diagnosis not present

## 2021-09-02 LAB — RAD ONC ARIA SESSION SUMMARY
Course Elapsed Days: 24
Plan Fractions Treated to Date: 18
Plan Prescribed Dose Per Fraction: 2 Gy
Plan Total Fractions Prescribed: 30
Plan Total Prescribed Dose: 60 Gy
Reference Point Dosage Given to Date: 36 Gy
Reference Point Session Dosage Given: 2 Gy
Session Number: 18

## 2021-09-05 ENCOUNTER — Other Ambulatory Visit: Payer: Self-pay | Admitting: *Deleted

## 2021-09-05 ENCOUNTER — Other Ambulatory Visit: Payer: Self-pay

## 2021-09-05 ENCOUNTER — Ambulatory Visit
Admission: RE | Admit: 2021-09-05 | Discharge: 2021-09-05 | Disposition: A | Discharge status: Home / Self Care | Payer: BLUE CROSS/BLUE SHIELD | Source: Ambulatory Visit | Attending: Radiation Oncology | Admitting: Radiation Oncology

## 2021-09-05 DIAGNOSIS — Z510 Encounter for antineoplastic radiation therapy: Secondary | ICD-10-CM | POA: Diagnosis not present

## 2021-09-05 LAB — RAD ONC ARIA SESSION SUMMARY
Course Elapsed Days: 27
Plan Fractions Treated to Date: 19
Plan Prescribed Dose Per Fraction: 2 Gy
Plan Total Fractions Prescribed: 30
Plan Total Prescribed Dose: 60 Gy
Reference Point Dosage Given to Date: 38 Gy
Reference Point Session Dosage Given: 2 Gy
Session Number: 19

## 2021-09-05 MED ORDER — ONDANSETRON HCL 8 MG PO TABS: 8.0000 mg | ORAL_TABLET | Freq: Three times a day (TID) | ORAL | 0 refills | Status: DC | PRN

## 2021-09-06 ENCOUNTER — Ambulatory Visit
Admission: RE | Admit: 2021-09-06 | Discharge: 2021-09-06 | Disposition: A | Discharge status: Home / Self Care | Payer: BLUE CROSS/BLUE SHIELD | Source: Ambulatory Visit | Attending: Radiation Oncology | Admitting: Radiation Oncology

## 2021-09-06 ENCOUNTER — Other Ambulatory Visit: Payer: Self-pay

## 2021-09-06 DIAGNOSIS — Z510 Encounter for antineoplastic radiation therapy: Secondary | ICD-10-CM | POA: Diagnosis not present

## 2021-09-06 LAB — RAD ONC ARIA SESSION SUMMARY
Course Elapsed Days: 28
Plan Fractions Treated to Date: 20
Plan Prescribed Dose Per Fraction: 2 Gy
Plan Total Fractions Prescribed: 30
Plan Total Prescribed Dose: 60 Gy
Reference Point Dosage Given to Date: 40 Gy
Reference Point Session Dosage Given: 2 Gy
Session Number: 20

## 2021-09-07 ENCOUNTER — Ambulatory Visit
Admission: RE | Admit: 2021-09-07 | Discharge: 2021-09-07 | Disposition: A | Discharge status: Home / Self Care | Payer: BLUE CROSS/BLUE SHIELD | Source: Ambulatory Visit | Attending: Radiation Oncology | Admitting: Radiation Oncology

## 2021-09-07 ENCOUNTER — Other Ambulatory Visit: Payer: Self-pay

## 2021-09-07 DIAGNOSIS — Z510 Encounter for antineoplastic radiation therapy: Secondary | ICD-10-CM | POA: Diagnosis not present

## 2021-09-07 LAB — RAD ONC ARIA SESSION SUMMARY
Course Elapsed Days: 29
Plan Fractions Treated to Date: 21
Plan Prescribed Dose Per Fraction: 2 Gy
Plan Total Fractions Prescribed: 30
Plan Total Prescribed Dose: 60 Gy
Reference Point Dosage Given to Date: 42 Gy
Reference Point Session Dosage Given: 2 Gy
Session Number: 21

## 2021-09-08 ENCOUNTER — Other Ambulatory Visit: Payer: Self-pay

## 2021-09-08 ENCOUNTER — Ambulatory Visit
Admission: RE | Admit: 2021-09-08 | Discharge: 2021-09-08 | Disposition: A | Discharge status: Home / Self Care | Payer: BLUE CROSS/BLUE SHIELD | Source: Ambulatory Visit | Attending: Radiation Oncology | Admitting: Radiation Oncology

## 2021-09-08 DIAGNOSIS — Z510 Encounter for antineoplastic radiation therapy: Secondary | ICD-10-CM | POA: Diagnosis not present

## 2021-09-08 LAB — RAD ONC ARIA SESSION SUMMARY
Course Elapsed Days: 30
Plan Fractions Treated to Date: 22
Plan Prescribed Dose Per Fraction: 2 Gy
Plan Total Fractions Prescribed: 30
Plan Total Prescribed Dose: 60 Gy
Reference Point Dosage Given to Date: 44 Gy
Reference Point Session Dosage Given: 2 Gy
Session Number: 22

## 2021-09-09 ENCOUNTER — Inpatient Hospital Stay: Payer: BLUE CROSS/BLUE SHIELD

## 2021-09-09 ENCOUNTER — Other Ambulatory Visit: Payer: Self-pay

## 2021-09-09 ENCOUNTER — Ambulatory Visit
Admission: RE | Admit: 2021-09-09 | Discharge: 2021-09-09 | Disposition: A | Discharge status: Home / Self Care | Payer: BLUE CROSS/BLUE SHIELD | Source: Ambulatory Visit | Attending: Radiation Oncology | Admitting: Radiation Oncology

## 2021-09-09 ENCOUNTER — Encounter: Payer: Self-pay | Admitting: Internal Medicine

## 2021-09-09 ENCOUNTER — Inpatient Hospital Stay (HOSPITAL_BASED_OUTPATIENT_CLINIC_OR_DEPARTMENT_OTHER): Payer: BLUE CROSS/BLUE SHIELD | Admitting: Internal Medicine

## 2021-09-09 VITALS — BP 107/73 | HR 73 | Temp 98.0°F | Resp 16 | Ht 70.0 in | Wt 167.3 lb

## 2021-09-09 DIAGNOSIS — Z510 Encounter for antineoplastic radiation therapy: Secondary | ICD-10-CM | POA: Diagnosis not present

## 2021-09-09 DIAGNOSIS — C719 Malignant neoplasm of brain, unspecified: Secondary | ICD-10-CM

## 2021-09-09 LAB — RAD ONC ARIA SESSION SUMMARY
Course Elapsed Days: 31
Plan Fractions Treated to Date: 23
Plan Prescribed Dose Per Fraction: 2 Gy
Plan Total Fractions Prescribed: 30
Plan Total Prescribed Dose: 60 Gy
Reference Point Dosage Given to Date: 46 Gy
Reference Point Session Dosage Given: 2 Gy
Session Number: 23

## 2021-09-09 LAB — CBC WITH DIFFERENTIAL/PLATELET
Abs Immature Granulocytes: 0.07 10*3/uL (ref 0.00–0.07)
Basophils Absolute: 0.1 10*3/uL (ref 0.0–0.1)
Basophils Relative: 1 %
Eosinophils Absolute: 0.1 10*3/uL (ref 0.0–0.5)
Eosinophils Relative: 1 %
HCT: 40 % (ref 39.0–52.0)
Hemoglobin: 12.9 g/dL — ABNORMAL LOW (ref 13.0–17.0)
Immature Granulocytes: 1 %
Lymphocytes Relative: 11 %
Lymphs Abs: 0.8 10*3/uL (ref 0.7–4.0)
MCH: 30.7 pg (ref 26.0–34.0)
MCHC: 32.3 g/dL (ref 30.0–36.0)
MCV: 95.2 fL (ref 80.0–100.0)
Monocytes Absolute: 0.6 10*3/uL (ref 0.1–1.0)
Monocytes Relative: 8 %
Neutro Abs: 6.2 10*3/uL (ref 1.7–7.7)
Neutrophils Relative %: 78 %
Platelets: 287 10*3/uL (ref 150–400)
RBC: 4.2 MIL/uL — ABNORMAL LOW (ref 4.22–5.81)
RDW: 13.1 % (ref 11.5–15.5)
WBC: 7.9 10*3/uL (ref 4.0–10.5)
nRBC: 0 % (ref 0.0–0.2)

## 2021-09-09 LAB — COMPREHENSIVE METABOLIC PANEL
ALT: 23 U/L (ref 0–44)
AST: 16 U/L (ref 15–41)
Albumin: 3.9 g/dL (ref 3.5–5.0)
Alkaline Phosphatase: 45 U/L (ref 38–126)
Anion gap: 5 (ref 5–15)
BUN: 22 mg/dL — ABNORMAL HIGH (ref 6–20)
CO2: 30 mmol/L (ref 22–32)
Calcium: 9.1 mg/dL (ref 8.9–10.3)
Chloride: 104 mmol/L (ref 98–111)
Creatinine, Ser: 0.9 mg/dL (ref 0.61–1.24)
GFR, Estimated: >60 mL/min (ref >60)
Glucose, Bld: 113 mg/dL — ABNORMAL HIGH (ref 70–99)
Potassium: 4.5 mmol/L (ref 3.5–5.1)
Sodium: 139 mmol/L (ref 135–145)
Total Bilirubin: 0.8 mg/dL (ref 0.3–1.2)
Total Protein: 6.5 g/dL (ref 6.5–8.1)

## 2021-09-09 NOTE — Progress Notes (Signed)
[facility] [place], [place] [address] [phone]   Interval Evaluation  Date of Service: 09/09/21 Patient Name: Todd [name] Patient MRN: [id number] Patient DOB: Mar 09, 1960 Provider: Ventura Sellers, MD  Identifying Statement:  [name] is a 61 y.o. male with left frontal glioblastoma   Oncologic History: Oncology History  Glioblastoma, IDH-wildtype ([place])  06/22/2021 Surgery   Left frontotemporal craniotomy, resection by Dr. Arnoldo Morale.  Path is GBM IDHwt.   07/18/2021 -  Chemotherapy   Patient is on Treatment Plan : BRAIN GLIOBLASTOMA Radiation Therapy With Concurrent Temozolomide 75 mg/m2 Daily Followed By Sequential Maintenance Temozolomide x 6-12 cycles       Biomarkers:  MGMT Unknown.  IDH 1/2 Wild type.  EGFR Unknown  TERT Unknown   Interval History: Todd [name] presents today for follow up, now having completed 5 weeks of radiation and Temodar.  He is doing well with therapy so far, no significant issues.  Reports occasional headaches, left eye blurriness.  He has been active mowing his lawn, outside often.  Not needing the walker.  Currently taking decadron $RemoveBeforeD'2mg'RzTExPEVQStocB$  daily.  H+P (07/18/21) Patient presented to medical attention last month with several weeks of progressive confusion, balance problems, headaches.  In addition, he had at least one full convulsion/seizure event.  CNS imaging done at [facility] ED demonstrated an enhancing left frontal and temporal mass lesion.  He underwent craniotomy and debulking resection with Dr. Arnoldo Morale on 06/22/21; path demonstrated glioblastoma.  Following surgery, he did not experiencing worsening of weakness, gait, or cognition.  Headaches have persisted however, near daily.  No seizure events since prior to surgery.  Currently living at home with his wife, walking independently, and taking care of own activities of daily living.  He has a chronic speech impediment which is  unchanged from prior baseline.  Decadron was dosed at $Remove'4mg'UmPQiCD$  TID and stopped abruptly ~5 days ago without taper.  Medications: Current Outpatient Medications on File Prior to Visit  Medication Sig Dispense Refill   dexamethasone (DECADRON) 2 MG tablet Take 1 tablet (2 mg total) by mouth daily. 100 tablet 0   diphenhydrAMINE HCl, Sleep, (ZZZQUIL PO) Take 30 mLs by mouth daily as needed (takes at bedtime for sleep).     escitalopram (LEXAPRO) 10 MG tablet Take 1 tablet (10 mg total) by mouth daily. 30 tablet 3   HYDROcodone-acetaminophen (NORCO/VICODIN) 5-325 MG tablet Take 1 tablet by mouth every 4 (four) hours as needed for moderate pain. (Patient not taking: Reported on 08/22/2021) 30 tablet 0   lansoprazole (PREVACID) 30 MG capsule Take 1 capsule (30 mg total) by mouth daily at 12 noon. 30 capsule 3   levETIRAcetam (KEPPRA) 500 MG tablet Take 1 tablet (500 mg total) by mouth 2 (two) times daily. 120 tablet 1   losartan (COZAAR) 25 MG tablet Take 1 tablet (25 mg total) by mouth daily. 90 tablet 1   metoprolol succinate (TOPROL XL) 50 MG 24 hr tablet Take 1 tablet (50 mg total) by mouth daily. Take with or immediately following a meal. 90 tablet 1   Multiple Vitamins-Minerals (COMPLETE MULTIVITAMIN/MINERAL PO) Take by mouth.     ondansetron (ZOFRAN) 8 MG tablet Take 1 tablet (8 mg total) by mouth 2 (two) times daily. 30 tablet 0   ondansetron (ZOFRAN) 8 MG tablet Take 1 tablet (8 mg total) by mouth every 8 (eight) hours as needed for nausea or vomiting. 30 tablet 0   Sennosides-Docusate Sodium (STOOL  SOFTENER/LAXATIVE PO) Take by mouth.     tamsulosin (FLOMAX) 0.4 MG CAPS capsule Take 1 capsule (0.4 mg total) by mouth daily. 30 capsule 0   temozolomide (TEMODAR) 140 MG capsule Take 1 capsule (140 mg total) by mouth daily. May take on an empty stomach to decrease nausea & vomiting. 42 capsule 0   No current facility-administered medications on file prior to visit.    Allergies: No Known  Allergies Past Medical History:  Past Medical History:  Diagnosis Date   Anxiety    CHF (congestive heart failure) ([place])    Depression    Past Surgical History:  Past Surgical History:  Procedure Laterality Date   APPLICATION OF CRANIAL NAVIGATION Left 06/22/2021   Procedure: APPLICATION OF CRANIAL NAVIGATION;  Surgeon: Newman Pies, MD;  Location: [facility];  Service: Neurosurgery;  Laterality: Left;   CRANIOTOMY Left 06/22/2021   Procedure: LEFT CRANIOTOMY FOR TUMOR EXCISION;  Surgeon: Newman Pies, MD;  Location: [facility];  Service: Neurosurgery;  Laterality: Left;   RADIOLOGY WITH ANESTHESIA N/A 06/22/2021   Procedure: MRI BRAIN  WITH ANESTHESIA;  Surgeon: Radiologist, Medication, MD;  Location: [facility];  Service: Radiology;  Laterality: N/A;   Social History:  Social History   Socioeconomic History   Marital status: Married    Spouse name: Not on file   Number of children: Not on file   Years of education: Not on file   Highest education level: Not on file  Occupational History   Not on file  Tobacco Use   Smoking status: Former    Types: E-cigarettes    Start date: 2020   Smokeless tobacco: Never  Vaping Use   Vaping Use: Every day   Substances: Nicotine, Flavoring  Substance and Sexual Activity   Alcohol use: Never   Drug use: Never   Sexual activity: Yes    Birth control/protection: None  Other Topics Concern   Not on file  Social History Narrative   Not on file   Social Determinants of Health   Financial Resource Strain: Low Risk  (07/19/2021)   Overall Financial Resource Strain (CARDIA)    Difficulty of Paying Living Expenses: Not hard at all  Food Insecurity: No Food Insecurity (07/19/2021)   Hunger Vital Sign    Worried About Running Out of Food in the Last Year: Never true    [place] in the Last Year: Never true  Transportation Needs: No Transportation Needs (07/19/2021)   PRAPARE - Hydrologist (Medical): No    Lack of  Transportation (Non-Medical): No  Physical Activity: Not on file  Stress: Not on file  Social Connections: Not on file  Intimate Partner Violence: Not on file   Family History:  Family History  Problem Relation Age of Onset   Cancer Mother    Dementia Father     Review of Systems: Constitutional: Doesn't report fevers, chills or abnormal weight loss Eyes: Doesn't report blurriness of vision Ears, nose, mouth, throat, and face: Doesn't report sore throat Respiratory: Doesn't report cough, dyspnea or wheezes Cardiovascular: Doesn't report palpitation, chest discomfort  Gastrointestinal:  Doesn't report nausea, constipation, diarrhea GU: Doesn't report incontinence Skin: Doesn't report skin rashes Neurological: Per HPI Musculoskeletal: Doesn't report joint pain Behavioral/Psych: Doesn't report anxiety  Physical Exam: Vitals:   09/09/21 0931  BP: 107/73  Pulse: 73  Resp: 16  Temp: 98 F (36.7 C)  SpO2: 100%   KPS: 80. General: Alert, cooperative, pleasant, in no  acute distress Head: Normal EENT: No conjunctival injection or scleral icterus.  Lungs: Resp effort normal Cardiac: Regular rate Abdomen: Non-distended abdomen Skin: No rashes cyanosis or petechiae. Extremities: No clubbing or edema  Neurologic Exam: Mental Status: Awake, alert, attentive to examiner. Oriented to self and environment. Language is noted for mild dysphasia, chronic disarticulation and stutter. Cranial Nerves: Visual acuity is grossly normal. Visual fields are full. Extra-ocular movements intact. No ptosis. Face is symmetric Motor: Tone and bulk are normal. Power is full in both arms and legs. Reflexes are symmetric, no pathologic reflexes present.  Sensory: Intact to light touch Gait: Normal.   Labs: I have reviewed the data as listed    Component Value Date/Time   NA 135 08/19/2021 0919   K 3.9 08/19/2021 0919   CL 99 08/19/2021 0919   CO2 30 08/19/2021 0919   GLUCOSE 98 08/19/2021  0919   BUN 23 (H) 08/19/2021 0919   CREATININE 0.86 08/19/2021 0919   CALCIUM 8.4 (L) 08/19/2021 0919   PROT 6.1 (L) 08/19/2021 0919   ALBUMIN 3.6 08/19/2021 0919   AST 14 (L) 08/19/2021 0919   ALT 17 08/19/2021 0919   ALKPHOS 41 08/19/2021 0919   BILITOT 0.7 08/19/2021 0919   GFRNONAA >60 08/19/2021 0919   Lab Results  Component Value Date   WBC 12.9 (H) 08/19/2021   NEUTROABS 10.5 (H) 08/19/2021   HGB 12.8 (L) 08/19/2021   HCT 39.9 08/19/2021   MCV 95.0 08/19/2021   PLT 253 08/19/2021    Imaging:  ECHOCARDIOGRAM COMPLETE  Result Date: 08/31/2021    ECHOCARDIOGRAM REPORT   Patient Name:   NKOSI CORTRIGHT Date of Exam: 08/31/2021 Medical Rec #:  [id number]        Height:       70.0 in Accession #:    [id number]       Weight:       165.0 lb Date of Birth:  [birth year]        BSA:          1.923 m Patient Age:    70 years         BP:           102/62 mmHg Patient Gender: M                HR:           85 bpm. Exam Location:  [facility] Procedure: 2D Echo, Cardiac Doppler and Color Doppler Indications:    I50.9 (ICD-10-CM) - Chronic congestive heart failure,                 unspecified heart failure type ([place])  History:        Patient has no prior history of Echocardiogram examinations.                 CHF; Risk Factors:Hypertension and Current Smoker. Alcohol                 dependence in remission ([place]). Benign neoplasm of brain,                 unspecified ([place]). Hx of COVID-19.  Sonographer:    Alvino Chapel RCS Referring Phys: [id number] PAULA V ROSS IMPRESSIONS  1. Left ventricular ejection fraction, by estimation, is 60 to 65%. The left ventricle has normal function. The left ventricle has no regional wall motion abnormalities. Left ventricular diastolic parameters are consistent with Grade I diastolic dysfunction (impaired relaxation).  2. Right ventricular  systolic function is normal. The right ventricular size is normal.  3. The mitral valve is normal in structure. Trivial mitral valve  regurgitation. No evidence of mitral stenosis.  4. The aortic valve has an indeterminant number of cusps. Aortic valve regurgitation is not visualized. No aortic stenosis is present.  5. Aortic dilatation noted. There is mild dilatation of the aortic root, measuring 40 mm.  6. The inferior vena cava is normal in size with <50% respiratory variability, suggesting right atrial pressure of 8 mmHg. FINDINGS  Left Ventricle: Left ventricular ejection fraction, by estimation, is 60 to 65%. The left ventricle has normal function. The left ventricle has no regional wall motion abnormalities. The left ventricular internal cavity size was normal in size. There is  no left ventricular hypertrophy. Left ventricular diastolic parameters are consistent with Grade I diastolic dysfunction (impaired relaxation). Normal left ventricular filling pressure. Right Ventricle: The right ventricular size is normal. Right vetricular wall thickness was not well visualized. Right ventricular systolic function is normal. Left Atrium: Left atrial size was normal in size. Right Atrium: Right atrial size was normal in size. Pericardium: There is no evidence of pericardial effusion. Mitral Valve: The mitral valve is normal in structure. Trivial mitral valve regurgitation. No evidence of mitral valve stenosis. Tricuspid Valve: The tricuspid valve is normal in structure. Tricuspid valve regurgitation is trivial. No evidence of tricuspid stenosis. Aortic Valve: The aortic valve has an indeterminant number of cusps. Aortic valve regurgitation is not visualized. No aortic stenosis is present. Aortic valve mean gradient measures 2.9 mmHg. Aortic valve peak gradient measures 5.6 mmHg. Aortic valve area, by VTI measures 2.94 cm. Pulmonic Valve: The pulmonic valve was not well visualized. Pulmonic valve regurgitation is not visualized. No evidence of pulmonic stenosis. Aorta: Aortic dilatation noted. There is mild dilatation of the aortic root, measuring  40 mm. Venous: The inferior vena cava is normal in size with less than 50% respiratory variability, suggesting right atrial pressure of 8 mmHg. IAS/Shunts: No atrial level shunt detected by color flow Doppler.  LEFT VENTRICLE PLAX 2D LVIDd:         5.00 cm   Diastology LVIDs:         2.80 cm   LV e' medial:    7.07 cm/s LV PW:         1.10 cm   LV E/e' medial:  8.0 LV IVS:        0.90 cm   LV e' lateral:   8.92 cm/s LVOT diam:     2.30 cm   LV E/e' lateral: 6.3 LV SV:         65 LV SV Index:   34 LVOT Area:     4.15 cm  RIGHT VENTRICLE             IVC RV S prime:     16.50 cm/s  IVC diam: 2.00 cm TAPSE (M-mode): 2.6 cm LEFT ATRIUM             Index        RIGHT ATRIUM           Index LA diam:        2.30 cm 1.20 cm/m   RA Area:     18.50 cm LA Vol (A2C):   46.9 ml 24.38 ml/m  RA Volume:   60.50 ml  31.45 ml/m LA Vol (A4C):   25.3 ml 13.15 ml/m LA Biplane Vol: 36.1 ml 18.77 ml/m  AORTIC VALVE AV Area (  Vmax):    2.67 cm AV Area (Vmean):   2.77 cm AV Area (VTI):     2.94 cm AV Vmax:           118.58 cm/s AV Vmean:          79.878 cm/s AV VTI:            0.222 m AV Peak Grad:      5.6 mmHg AV Mean Grad:      2.9 mmHg LVOT Vmax:         76.25 cm/s LVOT Vmean:        53.250 cm/s LVOT VTI:          0.157 m LVOT/AV VTI ratio: 0.71  AORTA Ao Root diam: 4.00 cm MITRAL VALVE MV Area (PHT): 3.53 cm    SHUNTS MV Decel Time: 215 msec    Systemic VTI:  0.16 m MV E velocity: 56.30 cm/s  Systemic Diam: 2.30 cm MV A velocity: 74.10 cm/s MV E/A ratio:  0.76 Carlyle Dolly MD Electronically signed by Carlyle Dolly MD Signature Date/Time: 08/31/2021/12:33:07 PM    Final      Assessment/Plan Glioblastoma, IDH-wildtype ([place])  Ali Lowe is clinically stable today, now having completed 5 weeks of IMRT and Temozolomide.  Labs are within normal limits.  We ultimately recommended continuing with course of intensity modulated radiation therapy and concurrent daily Temozolomide.  Radiation will be administered Mon-Fri  over 6 weeks, Temodar will be dosed at $Remove'75mg'dJqbRAL$ /m2 to be given daily over 42 days.  We reviewed side effects of temodar, including fatigue, nausea/vomiting, constipation, and cytopenias.  Chemotherapy should be held for the following:  ANC less than 1,000  Platelets less than 100,000  LFT or creatinine greater than 2x ULN  If clinical concerns/contraindications develop  Decadron should be decreased to $RemoveBefo'1mg'hIlUgUYeoYV$  daily until radiation is complete, then stopped if tolerated.  Keppra should continue $RemoveBeforeD'500mg'YSYJuQQClEfJNS$  BID for now.  We ask that Estevon Fluke return to clinic in 1 months following post-RT brain MRI, or sooner as needed.  All questions were answered. The patient knows to call the clinic with any problems, questions or concerns. No barriers to learning were detected.  The total time spent in the encounter was 30 minutes and more than 50% was on counseling and review of test results   Ventura Sellers, MD Medical Director of Neuro-Oncology [facility] [place] 09/09/21 9:25 AM

## 2021-09-09 NOTE — Progress Notes (Signed)
Patient states he is having blurred vision and some irritation in left eye. Has occasional issues with walking and starts to shake while trying to walk. When the shaking occurs he can get "shooting headaches" that last as long as he is unsteady. Also mentions low energy and feeling very fatigue.

## 2021-09-12 ENCOUNTER — Ambulatory Visit
Admission: RE | Admit: 2021-09-12 | Discharge: 2021-09-12 | Disposition: A | Discharge status: Home / Self Care | Payer: BLUE CROSS/BLUE SHIELD | Source: Ambulatory Visit | Attending: Radiation Oncology | Admitting: Radiation Oncology

## 2021-09-12 ENCOUNTER — Other Ambulatory Visit: Payer: Self-pay

## 2021-09-12 DIAGNOSIS — Z510 Encounter for antineoplastic radiation therapy: Secondary | ICD-10-CM | POA: Diagnosis not present

## 2021-09-12 LAB — RAD ONC ARIA SESSION SUMMARY
Course Elapsed Days: 34
Plan Fractions Treated to Date: 24
Plan Prescribed Dose Per Fraction: 2 Gy
Plan Total Fractions Prescribed: 30
Plan Total Prescribed Dose: 60 Gy
Reference Point Dosage Given to Date: 48 Gy
Reference Point Session Dosage Given: 2 Gy
Session Number: 24

## 2021-09-13 ENCOUNTER — Ambulatory Visit
Admission: RE | Admit: 2021-09-13 | Discharge: 2021-09-13 | Disposition: A | Discharge status: Home / Self Care | Payer: BLUE CROSS/BLUE SHIELD | Source: Ambulatory Visit | Attending: Radiation Oncology | Admitting: Radiation Oncology

## 2021-09-13 ENCOUNTER — Other Ambulatory Visit: Payer: Self-pay

## 2021-09-13 DIAGNOSIS — C711 Malignant neoplasm of frontal lobe: Secondary | ICD-10-CM | POA: Insufficient documentation

## 2021-09-13 DIAGNOSIS — Z510 Encounter for antineoplastic radiation therapy: Secondary | ICD-10-CM | POA: Insufficient documentation

## 2021-09-13 LAB — RAD ONC ARIA SESSION SUMMARY
Course Elapsed Days: 35
Plan Fractions Treated to Date: 25
Plan Prescribed Dose Per Fraction: 2 Gy
Plan Total Fractions Prescribed: 30
Plan Total Prescribed Dose: 60 Gy
Reference Point Dosage Given to Date: 50 Gy
Reference Point Session Dosage Given: 2 Gy
Session Number: 25

## 2021-09-14 ENCOUNTER — Ambulatory Visit
Admission: RE | Admit: 2021-09-14 | Discharge: 2021-09-14 | Disposition: A | Discharge status: Home / Self Care | Payer: BLUE CROSS/BLUE SHIELD | Source: Ambulatory Visit | Attending: Radiation Oncology | Admitting: Radiation Oncology

## 2021-09-14 ENCOUNTER — Other Ambulatory Visit: Payer: Self-pay

## 2021-09-14 DIAGNOSIS — Z510 Encounter for antineoplastic radiation therapy: Secondary | ICD-10-CM | POA: Diagnosis not present

## 2021-09-14 LAB — RAD ONC ARIA SESSION SUMMARY
Course Elapsed Days: 36
Plan Fractions Treated to Date: 26
Plan Prescribed Dose Per Fraction: 2 Gy
Plan Total Fractions Prescribed: 30
Plan Total Prescribed Dose: 60 Gy
Reference Point Dosage Given to Date: 52 Gy
Reference Point Session Dosage Given: 2 Gy
Session Number: 26

## 2021-09-15 ENCOUNTER — Other Ambulatory Visit: Payer: Self-pay

## 2021-09-15 ENCOUNTER — Encounter: Payer: Self-pay | Admitting: Pharmacist

## 2021-09-15 ENCOUNTER — Ambulatory Visit
Admission: RE | Admit: 2021-09-15 | Discharge: 2021-09-15 | Disposition: A | Discharge status: Home / Self Care | Payer: BLUE CROSS/BLUE SHIELD | Source: Ambulatory Visit | Attending: Radiation Oncology | Admitting: Radiation Oncology

## 2021-09-15 ENCOUNTER — Encounter: Payer: Self-pay | Admitting: Occupational Therapy

## 2021-09-15 DIAGNOSIS — Z510 Encounter for antineoplastic radiation therapy: Secondary | ICD-10-CM | POA: Diagnosis not present

## 2021-09-15 LAB — RAD ONC ARIA SESSION SUMMARY
Course Elapsed Days: 37
Plan Fractions Treated to Date: 27
Plan Prescribed Dose Per Fraction: 2 Gy
Plan Total Fractions Prescribed: 30
Plan Total Prescribed Dose: 60 Gy
Reference Point Dosage Given to Date: 54 Gy
Reference Point Session Dosage Given: 2 Gy
Session Number: 27

## 2021-09-16 ENCOUNTER — Ambulatory Visit
Admission: RE | Admit: 2021-09-16 | Discharge: 2021-09-16 | Disposition: A | Discharge status: Home / Self Care | Payer: BLUE CROSS/BLUE SHIELD | Source: Ambulatory Visit | Attending: Radiation Oncology | Admitting: Radiation Oncology

## 2021-09-16 ENCOUNTER — Other Ambulatory Visit: Payer: Self-pay

## 2021-09-16 DIAGNOSIS — Z510 Encounter for antineoplastic radiation therapy: Secondary | ICD-10-CM | POA: Diagnosis not present

## 2021-09-16 LAB — RAD ONC ARIA SESSION SUMMARY
Course Elapsed Days: 38
Plan Fractions Treated to Date: 28
Plan Prescribed Dose Per Fraction: 2 Gy
Plan Total Fractions Prescribed: 30
Plan Total Prescribed Dose: 60 Gy
Reference Point Dosage Given to Date: 56 Gy
Reference Point Session Dosage Given: 2 Gy
Session Number: 28

## 2021-09-19 ENCOUNTER — Ambulatory Visit
Admission: RE | Admit: 2021-09-19 | Discharge: 2021-09-19 | Disposition: A | Discharge status: Home / Self Care | Payer: BLUE CROSS/BLUE SHIELD | Source: Ambulatory Visit | Attending: Radiation Oncology | Admitting: Radiation Oncology

## 2021-09-19 ENCOUNTER — Other Ambulatory Visit: Payer: Self-pay

## 2021-09-19 DIAGNOSIS — Z510 Encounter for antineoplastic radiation therapy: Secondary | ICD-10-CM | POA: Diagnosis not present

## 2021-09-19 LAB — RAD ONC ARIA SESSION SUMMARY
Course Elapsed Days: 41
Plan Fractions Treated to Date: 29
Plan Prescribed Dose Per Fraction: 2 Gy
Plan Total Fractions Prescribed: 30
Plan Total Prescribed Dose: 60 Gy
Reference Point Dosage Given to Date: 58 Gy
Reference Point Session Dosage Given: 2 Gy
Session Number: 29

## 2021-09-20 ENCOUNTER — Ambulatory Visit
Admission: RE | Admit: 2021-09-20 | Discharge: 2021-09-20 | Disposition: A | Discharge status: Home / Self Care | Payer: BLUE CROSS/BLUE SHIELD | Source: Ambulatory Visit | Attending: Radiation Oncology | Admitting: Radiation Oncology

## 2021-09-20 ENCOUNTER — Other Ambulatory Visit: Payer: Self-pay

## 2021-09-20 DIAGNOSIS — Z510 Encounter for antineoplastic radiation therapy: Secondary | ICD-10-CM | POA: Diagnosis not present

## 2021-09-20 LAB — RAD ONC ARIA SESSION SUMMARY
Course Elapsed Days: 42
Plan Fractions Treated to Date: 30
Plan Prescribed Dose Per Fraction: 2 Gy
Plan Total Fractions Prescribed: 30
Plan Total Prescribed Dose: 60 Gy
Reference Point Dosage Given to Date: 60 Gy
Reference Point Session Dosage Given: 2 Gy
Session Number: 30

## 2021-10-12 ENCOUNTER — Ambulatory Visit
Admission: RE | Admit: 2021-10-12 | Discharge: 2021-10-12 | Disposition: A | Discharge status: Home / Self Care | Payer: BLUE CROSS/BLUE SHIELD | Source: Ambulatory Visit | Attending: Internal Medicine | Admitting: Internal Medicine

## 2021-10-12 DIAGNOSIS — C719 Malignant neoplasm of brain, unspecified: Secondary | ICD-10-CM | POA: Diagnosis present

## 2021-10-12 MED ORDER — GADOBUTROL 1 MMOL/ML IV SOLN
7.0000 mL | Freq: Once | INTRAVENOUS | Status: AC | PRN
  Administered 2021-10-12: 7 mL via INTRAVENOUS

## 2021-10-14 ENCOUNTER — Telehealth: Payer: Self-pay | Admitting: Pharmacist

## 2021-10-14 ENCOUNTER — Inpatient Hospital Stay: Payer: BLUE CROSS/BLUE SHIELD | Admitting: Licensed Clinical Social Worker

## 2021-10-14 ENCOUNTER — Other Ambulatory Visit (HOSPITAL_COMMUNITY): Payer: Self-pay

## 2021-10-14 ENCOUNTER — Encounter: Payer: Self-pay | Admitting: Internal Medicine

## 2021-10-14 ENCOUNTER — Telehealth: Payer: Self-pay | Admitting: Pharmacy Technician

## 2021-10-14 ENCOUNTER — Inpatient Hospital Stay: Payer: BLUE CROSS/BLUE SHIELD | Attending: Internal Medicine | Admitting: Internal Medicine

## 2021-10-14 VITALS — BP 108/70 | HR 86 | Temp 97.8°F | Ht 70.0 in | Wt 166.0 lb

## 2021-10-14 DIAGNOSIS — Z79899 Other long term (current) drug therapy: Secondary | ICD-10-CM | POA: Diagnosis not present

## 2021-10-14 DIAGNOSIS — C711 Malignant neoplasm of frontal lobe: Secondary | ICD-10-CM | POA: Insufficient documentation

## 2021-10-14 DIAGNOSIS — C719 Malignant neoplasm of brain, unspecified: Secondary | ICD-10-CM

## 2021-10-14 MED ORDER — TEMOZOLOMIDE 140 MG PO CAPS
150.0000 mg/m2/d | ORAL_CAPSULE | Freq: Every day | ORAL | 0 refills | Status: DC
  Filled 2021-10-14: qty 14, 7d supply, fill #0

## 2021-10-14 MED ORDER — ONDANSETRON HCL 8 MG PO TABS: 8.0000 mg | ORAL_TABLET | Freq: Two times a day (BID) | ORAL | 1 refills | Status: DC | PRN

## 2021-10-14 MED ORDER — LEVETIRACETAM 500 MG PO TABS: 1000.0000 mg | ORAL_TABLET | Freq: Two times a day (BID) | ORAL | 1 refills | Status: DC

## 2021-10-14 MED ORDER — TEMOZOLOMIDE 140 MG PO CAPS: 150.0000 mg/m2/d | ORAL_CAPSULE | Freq: Every day | ORAL | 0 refills | Status: DC

## 2021-10-14 NOTE — Telephone Encounter (Signed)
Oral Oncology Patient Advocate Encounter   Received notification that prior authorization for Temozolomide is required.   PA submitted on 10/14/2021 Key [id number] Status is pending     Lady Deutscher, CPhT-Adv Oncology Pharmacy Patient [place] Direct Number: [phone]  Fax: [phone]

## 2021-10-14 NOTE — Progress Notes (Signed)
Todd [name] Progress Note  Clinical Education officer, museum contacted caregiver by phone to discuss questions about insurance coverage.  Todd [name] stated she is not certain about the extent of the insurance coverage for the treatment.[name] suggested patient contact insurance company to get the details about their insurance plan and deductible information. Todd [name] agreed to contact  the insurance company directly and stated she would call [name] if she had any additional questions or concerns.    Adelene Amas, LCSW

## 2021-10-14 NOTE — Progress Notes (Signed)
[facility] [place], [place] [address] [phone]   Interval Evaluation  Date of Service: 10/14/21 Patient Name: Todd [name] Patient MRN: [id number] Patient DOB: [birth year] Provider: Ventura Sellers, MD  Identifying Statement:  [name] is a 61 y.o. male with left frontal glioblastoma   Oncologic History: Oncology History  Glioblastoma, IDH-wildtype ([place])  06/22/2021 Surgery   Left frontotemporal craniotomy, resection by Dr. Arnoldo Morale.  Path is GBM IDHwt.   07/18/2021 -  Chemotherapy   Patient is on Treatment Plan : BRAIN GLIOBLASTOMA Radiation Therapy With Concurrent Temozolomide 75 mg/m2 Daily Followed By Sequential Maintenance Temozolomide x 6-12 cycles       Biomarkers:  MGMT Unknown.  IDH 1/2 Wild type.  EGFR Unknown  TERT Unknown   Interval History: Jerral Mccauley presents today for follow up, now having completed full course of radiation and Temodar.  He describes several episodes of right hand/arm shaking, followed by imbalance and fall; this is over the past month.  Still reports occasional headaches.  He has been active mowing his lawn, outside often.  Not needing the walker.  No longer dosing decadron, Keppra is at $Rem'500mg'ldPE$  twice per day.  H+P (07/18/21) Patient presented to medical attention last month with several weeks of progressive confusion, balance problems, headaches.  In addition, he had at least one full convulsion/seizure event.  CNS [facility] done at [facility] ED demonstrated an enhancing left frontal and temporal mass lesion.  He underwent craniotomy and debulking resection with Dr. Arnoldo Morale on 06/22/21; path demonstrated glioblastoma.  Following surgery, he did not experiencing worsening of weakness, gait, or cognition.  Headaches have persisted however, near daily.  No seizure events since prior to surgery.  Currently living at home with his wife, walking independently, and taking care of own  activities of daily living.  He has a chronic speech impediment which is unchanged from prior baseline.  Decadron was dosed at $Remove'4mg'gFsWesO$  TID and stopped abruptly ~5 days ago without taper.  Medications: Current Outpatient Medications on File Prior to Visit  Medication Sig Dispense Refill   dexamethasone (DECADRON) 2 MG tablet Take 1 tablet (2 mg total) by mouth daily. 100 tablet 0   diphenhydrAMINE HCl, Sleep, (ZZZQUIL PO) Take 30 mLs by mouth daily as needed (takes at bedtime for sleep).     escitalopram (LEXAPRO) 10 MG tablet Take 1 tablet (10 mg total) by mouth daily. 30 tablet 3   HYDROcodone-acetaminophen (NORCO/VICODIN) 5-325 MG tablet Take 1 tablet by mouth every 4 (four) hours as needed for moderate pain. (Patient not taking: Reported on 08/22/2021) 30 tablet 0   lansoprazole (PREVACID) 30 MG capsule Take 1 capsule (30 mg total) by mouth daily at 12 noon. 30 capsule 3   levETIRAcetam (KEPPRA) 500 MG tablet Take 1 tablet (500 mg total) by mouth 2 (two) times daily. 120 tablet 1   losartan (COZAAR) 25 MG tablet Take 1 tablet (25 mg total) by mouth daily. 90 tablet 1   metoprolol succinate (TOPROL XL) 50 MG 24 hr tablet Take 1 tablet (50 mg total) by mouth daily. Take with or immediately following a meal. 90 tablet 1   Multiple Vitamins-Minerals (COMPLETE MULTIVITAMIN/MINERAL PO) Take by mouth.     ondansetron (ZOFRAN) 8 MG tablet Take 1 tablet (8 mg total) by mouth 2 (two) times daily. 30 tablet 0   ondansetron (ZOFRAN) 8 MG tablet Take 1 tablet (8 mg total) by mouth every 8 (eight) hours as needed  for nausea or vomiting. 30 tablet 0   Sennosides-Docusate Sodium (STOOL SOFTENER/LAXATIVE PO) Take by mouth.     tamsulosin (FLOMAX) 0.4 MG CAPS capsule Take 1 capsule (0.4 mg total) by mouth daily. 30 capsule 0   temozolomide (TEMODAR) 140 MG capsule Take 1 capsule (140 mg total) by mouth daily. May take on an empty stomach to decrease nausea & vomiting. 42 capsule 0   No current facility-administered  medications on file prior to visit.    Allergies: No Known Allergies Past Medical History:  Past Medical History:  Diagnosis Date   Anxiety    CHF (congestive heart failure) ([place])    Depression    Past Surgical History:  Past Surgical History:  Procedure Laterality Date   APPLICATION OF CRANIAL NAVIGATION Left 06/22/2021   Procedure: APPLICATION OF CRANIAL NAVIGATION;  Surgeon: Newman Pies, MD;  Location: [facility];  Service: Neurosurgery;  Laterality: Left;   CRANIOTOMY Left 06/22/2021   Procedure: LEFT CRANIOTOMY FOR TUMOR EXCISION;  Surgeon: Newman Pies, MD;  Location: [facility];  Service: Neurosurgery;  Laterality: Left;   RADIOLOGY WITH ANESTHESIA N/A 06/22/2021   Procedure: MRI BRAIN  WITH ANESTHESIA;  Surgeon: Radiologist, Medication, MD;  Location: [facility];  Service: Radiology;  Laterality: N/A;   Social History:  Social History   Socioeconomic History   Marital status: Married    Spouse name: Not on file   Number of children: Not on file   Years of education: Not on file   Highest education level: Not on file  Occupational History   Not on file  Tobacco Use   Smoking status: Former    Types: E-cigarettes    Start date: 2020   Smokeless tobacco: Never  Vaping Use   Vaping Use: Every day   Substances: Nicotine, Flavoring  Substance and Sexual Activity   Alcohol use: Never   Drug use: Never   Sexual activity: Yes    Birth control/protection: None  Other Topics Concern   Not on file  Social History Narrative   Not on file   Social Determinants of Health   Financial Resource Strain: Low Risk  (07/19/2021)   Overall Financial Resource Strain (CARDIA)    Difficulty of Paying Living Expenses: Not hard at all  Food Insecurity: No Food Insecurity (07/19/2021)   Hunger Vital Sign    Worried About Running Out of Food in the Last Year: Never true    [place] in the Last Year: Never true  Transportation Needs: No Transportation Needs (07/19/2021)   PRAPARE -  Hydrologist (Medical): No    Lack of Transportation (Non-Medical): No  Physical Activity: Not on file  Stress: Not on file  Social Connections: Not on file  Intimate Partner Violence: Not on file   Family History:  Family History  Problem Relation Age of Onset   Cancer Mother    Dementia Father     Review of Systems: Constitutional: Doesn't report fevers, chills or abnormal weight loss Eyes: Doesn't report blurriness of vision Ears, nose, mouth, throat, and face: Doesn't report sore throat Respiratory: Doesn't report cough, dyspnea or wheezes Cardiovascular: Doesn't report palpitation, chest discomfort  Gastrointestinal:  Doesn't report nausea, constipation, diarrhea GU: Doesn't report incontinence Skin: Doesn't report skin rashes Neurological: Per HPI Musculoskeletal: Doesn't report joint pain Behavioral/Psych: Doesn't report anxiety  Physical Exam: Vitals:   10/14/21 1006  BP: 108/70  Pulse: 86  Temp: 97.8 F (36.6 C)    KPS:  80. General: Alert, cooperative, pleasant, in no acute distress Head: Normal EENT: No conjunctival injection or scleral icterus.  Lungs: Resp effort normal Cardiac: Regular rate Abdomen: Non-distended abdomen Skin: No rashes cyanosis or petechiae. Extremities: No clubbing or edema  Neurologic Exam: Mental Status: Awake, alert, attentive to examiner. Oriented to self and environment. Language is noted for mild dysphasia, chronic disarticulation and stutter. Cranial Nerves: Visual acuity is grossly normal. Visual fields are full. Extra-ocular movements intact. No ptosis. Face is symmetric Motor: Tone and bulk are normal. Power is full in both arms and legs. Reflexes are symmetric, no pathologic reflexes present.  Sensory: Intact to light touch Gait: Normal.   Labs: I have reviewed the data as listed    Component Value Date/Time   NA 139 09/09/2021 0922   K 4.5 09/09/2021 0922   CL 104 09/09/2021 0922    CO2 30 09/09/2021 0922   GLUCOSE 113 (H) 09/09/2021 0922   BUN 22 (H) 09/09/2021 0922   CREATININE 0.90 09/09/2021 0922   CALCIUM 9.1 09/09/2021 0922   PROT 6.5 09/09/2021 0922   ALBUMIN 3.9 09/09/2021 0922   AST 16 09/09/2021 0922   ALT 23 09/09/2021 0922   ALKPHOS 45 09/09/2021 0922   BILITOT 0.8 09/09/2021 0922   GFRNONAA >60 09/09/2021 0922   Lab Results  Component Value Date   WBC 7.9 09/09/2021   NEUTROABS 6.2 09/09/2021   HGB 12.9 (L) 09/09/2021   HCT 40.0 09/09/2021   MCV 95.2 09/09/2021   PLT 287 09/09/2021   Imaging:  [facility] Clinician Interpretation: I have personally reviewed the CNS images as listed.  My interpretation, in the context of the patient's clinical presentation, is treatment effect vs true progression  MR BRAIN W WO CONTRAST  Result Date: 10/12/2021 CLINICAL DATA:  Glioblastoma, IDH-wildtype ([place]) C71.9 (ICD-10-CM). Brain/CNS neoplasm, assess treatment response. EXAM: MRI HEAD WITHOUT AND WITH CONTRAST TECHNIQUE: Multiplanar, multiecho pulse sequences of the brain and surrounding structures were obtained without and with intravenous contrast. CONTRAST:  36mL GADAVIST GADOBUTROL 1 MMOL/ML IV SOLN COMPARISON:  MRI of the brain July 21, 2021. FINDINGS: Brain: Postsurgical changes again noted from tumor debulking with surgical cavity in the anterior left temporal lobe. Similar extension of infiltrating T2 hyperintensity involving surgical cavity margins extending to the mesial temporal lobe, including hippocampus, inferior left frontal lobe, left insula, lentiform nucleus and internal capsule. There is, however, interval development of new foci of contrast enhancement within the areas of T2 hyperintensity, including the anterior left insula, left hippocampal tail and uncus. Pre-existing area of contrast enhancement at the margins of the surgical cavity appear more prominent in the current study. Persistent mass effect on the left cerebral peduncle and left posterior  cerebral artery, similar to prior MRI. The left PCA remains patent. There is also mass effect on the left basal vein which also remains patent. No acute infarct, hemorrhage or hydrocephalus. Vascular: Normal arterial flow voids. A nonocclusive filling defect is noted on postcontrast images within the right jugular bulb, improved compared to prior MRI. Skull and upper cervical spine: Postsurgical changes from left frontotemporal parietal craniotomy Sinuses/Orbits: Negative. Other: Bilateral mastoid effusion. IMPRESSION: 1. Similar extension all infiltrating T2 hyperintensity involving the left frontotemporal, insular and basal ganglia region with new foci of contrast enhancement and more more prominent enhancement of areas of pre-existing contrast enhancement on prior MRI. Findings may represent new area of tumor degeneration, although a radionecrosis component cannot be excluded. 2. Persistent mass effect on the left cerebral peduncle with  mass effect on the left PCA and left basal vein which remain patent. 3. Nonocclusive filling defect within the right jugular bulb, likely nonocclusive thrombus, has decreased in size compared to prior MRI. Electronically Signed   By: Pedro Earls M.D.   On: 10/12/2021 16:18     Assessment/Plan Glioblastoma, IDH-wildtype ([place])  Zaydrian Batta is clinically stable today, now having completed full course of IMRT and Temozolomide.  MRI brain demonstrates several foci of new nodular enhancement not seen on the post-operative study.  This may be consistent with post-radiation changes, pseudoprogression, vs organic progression of disease.  We recommended continued monitoring, transition to adjuvant therapy.   We recommended initiating treatment with cycle #1 Temozolomide 150 mg/m2, on for five days and off for twenty three days in twenty eight day cycles. The patient will have a complete blood count performed on days 21 and 28 of each cycle, and a comprehensive  metabolic panel performed on day 28 of each cycle. Labs may need to be performed more often. Zofran will prescribed for home use for nausea/vomiting.   Informed consent was obtained verbally at bedside to proceed with oral chemotherapy.  Chemotherapy should be held for the following:  ANC less than 1,000  Platelets less than 100,000  LFT or creatinine greater than 2x ULN  If clinical concerns/contraindications develop  Keppra should increase to $RemoveBef'1000mg'FygOiOeSFc$  BID due to suspicion for focal seizures.  We ask that Tobey Schmelzle return to clinic in 1 months prior to cycle #2 with labs for evaluation, or sooner as needed.  All questions were answered. The patient knows to call the clinic with any problems, questions or concerns. No barriers to learning were detected.  The total time spent in the encounter was 30 minutes and more than 50% was on counseling and review of test results   Ventura Sellers, MD Medical Director of Neuro-Oncology [facility] at [place] 10/14/21 10:01 AM

## 2021-10-14 NOTE — Telephone Encounter (Addendum)
Oral Oncology Patient Advocate Encounter  Prior Authorization for Temozolomide has been approved.    PA# [id number] Effective dates: 10/14/2021 through 10/14/2022  Patients co-pay is $322.30.    Lady Deutscher, CPhT-Adv Oncology Pharmacy Patient [place] Direct Number: [phone]  Fax: [phone]

## 2021-10-14 NOTE — Progress Notes (Signed)
Patient states he has weakness in legs after sitting for to long. Patient verbalized he had 3 falls in past 4 weeks.

## 2021-10-14 NOTE — Telephone Encounter (Signed)
Oral Oncology Pharmacist Encounter  Received new prescription for *** for the treatment of *** in conjunction with ***, planned duration ***.  Labs from *** assessed, ***. Prescription dose and frequency assessed.   Current medication list in Epic reviewed, *** DDIs with *** identified:  Evaluated chart and *** patient barriers to medication adherence identified.   Prescription has been e-scribed to the [facility] for benefits analysis and approval.  Oral Oncology Clinic will continue to follow for insurance authorization, copayment issues, initial counseling and start date.  Patient agreed to treatment on *** per MD documentation.  Darl Pikes, PharmD, BCPS, BCOP, CPP Hematology/Oncology Clinical Pharmacist Practitioner [place]/DB/AP Oral [facility] [phone]  10/14/2021 11:04 AM

## 2021-10-18 NOTE — Telephone Encounter (Signed)
Due to high cost with his insurance, patient will continue to fill at [facility] using discounted pricing.

## 2021-11-02 ENCOUNTER — Other Ambulatory Visit: Payer: Self-pay | Admitting: Internal Medicine

## 2021-11-02 MED ORDER — LEVETIRACETAM 500 MG PO TABS: 1000.0000 mg | ORAL_TABLET | Freq: Two times a day (BID) | ORAL | 1 refills | Status: DC

## 2021-11-02 NOTE — Telephone Encounter (Signed)
New prescription for Keppra 1,000 mg BID e-scribed on 10/14/21.

## 2021-11-03 ENCOUNTER — Encounter: Payer: Self-pay | Admitting: Radiation Oncology

## 2021-11-03 ENCOUNTER — Ambulatory Visit
Admission: RE | Admit: 2021-11-03 | Discharge: 2021-11-03 | Disposition: A | Discharge status: Home / Self Care | Payer: BLUE CROSS/BLUE SHIELD | Source: Ambulatory Visit | Attending: Radiation Oncology | Admitting: Radiation Oncology

## 2021-11-03 VITALS — BP 116/74 | HR 93 | Temp 96.8°F | Resp 16 | Wt 168.0 lb

## 2021-11-03 DIAGNOSIS — C719 Malignant neoplasm of brain, unspecified: Secondary | ICD-10-CM

## 2021-11-03 DIAGNOSIS — Z923 Personal history of irradiation: Secondary | ICD-10-CM | POA: Insufficient documentation

## 2021-11-03 DIAGNOSIS — C712 Malignant neoplasm of temporal lobe: Secondary | ICD-10-CM | POA: Diagnosis present

## 2021-11-03 NOTE — Progress Notes (Signed)
Radiation Oncology Follow up Note  Name: Todd [name]   Date:   11/03/2021 MRN:  [id number] DOB: 06-02-1960    This 61 y.o. male presents to the clinic today for 1 month follow-up status post partial brain radiation for GBM of the left frontal temporal region status post gross resection.  REFERRING PROVIDER: Lindell Spar, MD  HPI: Patient is a 61 year old male now out 1 month having completed partial brain irradiation for GBM of the left frontal to right temporal region.  Patient received concurrent Temodar.  Seen today in routine follow-up he is doing well no specific complaints does have some seizure activity.  Does occasionally have headaches no longer on Decadron.  Keppra is at 500 mg twice daily.  He had a recent MRI scan showing several foci of nodular enhancement not seen in the postoperative study but may be consistent with postradiation changes versus progression.  COMPLICATIONS OF TREATMENT: none  FOLLOW UP COMPLIANCE: keeps appointments   PHYSICAL EXAM:  BP 116/74 (BP Location: Left Arm, Patient Position: Sitting)   Pulse 93   Temp (!) 96.8 F (36 C) (Tympanic)   Resp 16   Wt 168 lb (76.2 kg)   SpO2 98%   BMI 24.11 kg/m  Crude visual fields within normal range motor or sensory i and DTR levels are equal and symmetric in upper lower extremities.  Proprioception is intact.  Incision is well-healed.  Well-developed well-nourished patient in NAD. HEENT reveals PERLA, EOMI, discs not visualized.  Oral cavity is clear. No oral mucosal lesions are identified. Neck is clear without evidence of cervical or supraclavicular adenopathy. Lungs are clear to A&P. Cardiac examination is essentially unremarkable with regular rate and rhythm without murmur rub or thrill. Abdomen is benign with no organomegaly or masses noted. Motor sensory and DTR levels are equal and symmetric in the upper and lower extremities. Cranial nerves II through XII are grossly intact. Proprioception is intact. No  peripheral adenopathy or edema is identified. No motor or sensory levels are noted. Crude visual fields are within normal range. RADIOLOGY RESULTS: MRI scan reviewed compatible with above-stated findings  PLAN: Present time patient is doing well.  I reviewed his MRI scans as described above.  Patient is moving back to [place] and will be followed by medical oncology in that location.  We will be happy to provide any medical information or assistance they need in his future care.  I would like to take this opportunity to thank you for allowing me to participate in the care of your patient.Noreene Filbert, MD

## 2021-11-17 ENCOUNTER — Other Ambulatory Visit: Payer: Self-pay | Admitting: Internal Medicine

## 2021-11-18 ENCOUNTER — Inpatient Hospital Stay: Payer: BLUE CROSS/BLUE SHIELD | Attending: Internal Medicine | Admitting: Internal Medicine

## 2021-11-18 ENCOUNTER — Inpatient Hospital Stay: Payer: BLUE CROSS/BLUE SHIELD

## 2021-11-18 ENCOUNTER — Other Ambulatory Visit (HOSPITAL_COMMUNITY): Payer: Self-pay

## 2021-11-18 ENCOUNTER — Other Ambulatory Visit: Payer: Self-pay

## 2021-11-18 ENCOUNTER — Encounter: Payer: Self-pay | Admitting: Internal Medicine

## 2021-11-18 VITALS — BP 112/70 | HR 73 | Temp 97.8°F | Resp 20 | Wt 165.7 lb

## 2021-11-18 DIAGNOSIS — R112 Nausea with vomiting, unspecified: Secondary | ICD-10-CM | POA: Diagnosis not present

## 2021-11-18 DIAGNOSIS — C719 Malignant neoplasm of brain, unspecified: Secondary | ICD-10-CM

## 2021-11-18 DIAGNOSIS — Z79899 Other long term (current) drug therapy: Secondary | ICD-10-CM | POA: Diagnosis not present

## 2021-11-18 DIAGNOSIS — C711 Malignant neoplasm of frontal lobe: Secondary | ICD-10-CM | POA: Insufficient documentation

## 2021-11-18 DIAGNOSIS — R251 Tremor, unspecified: Secondary | ICD-10-CM | POA: Insufficient documentation

## 2021-11-18 DIAGNOSIS — R2689 Other abnormalities of gait and mobility: Secondary | ICD-10-CM | POA: Insufficient documentation

## 2021-11-18 LAB — CBC WITH DIFFERENTIAL/PLATELET
Abs Immature Granulocytes: 0.02 10*3/uL (ref 0.00–0.07)
Basophils Absolute: 0.1 10*3/uL (ref 0.0–0.1)
Basophils Relative: 1 %
Eosinophils Absolute: 0.1 10*3/uL (ref 0.0–0.5)
Eosinophils Relative: 2 %
HCT: 40.4 % (ref 39.0–52.0)
Hemoglobin: 13.4 g/dL (ref 13.0–17.0)
Immature Granulocytes: 0 %
Lymphocytes Relative: 14 %
Lymphs Abs: 0.8 10*3/uL (ref 0.7–4.0)
MCH: 30.3 pg (ref 26.0–34.0)
MCHC: 33.2 g/dL (ref 30.0–36.0)
MCV: 91.4 fL (ref 80.0–100.0)
Monocytes Absolute: 0.5 10*3/uL (ref 0.1–1.0)
Monocytes Relative: 9 %
Neutro Abs: 4.2 10*3/uL (ref 1.7–7.7)
Neutrophils Relative %: 74 %
Platelets: 232 10*3/uL (ref 150–400)
RBC: 4.42 MIL/uL (ref 4.22–5.81)
RDW: 12.6 % (ref 11.5–15.5)
WBC: 5.7 10*3/uL (ref 4.0–10.5)
nRBC: 0 % (ref 0.0–0.2)

## 2021-11-18 LAB — COMPREHENSIVE METABOLIC PANEL
ALT: 13 U/L (ref 0–44)
AST: 16 U/L (ref 15–41)
Albumin: 4 g/dL (ref 3.5–5.0)
Alkaline Phosphatase: 51 U/L (ref 38–126)
Anion gap: 5 (ref 5–15)
BUN: 17 mg/dL (ref 6–20)
CO2: 28 mmol/L (ref 22–32)
Calcium: 8.8 mg/dL — ABNORMAL LOW (ref 8.9–10.3)
Chloride: 107 mmol/L (ref 98–111)
Creatinine, Ser: 0.81 mg/dL (ref 0.61–1.24)
GFR, Estimated: >60 mL/min (ref >60)
Glucose, Bld: 77 mg/dL (ref 70–99)
Potassium: 4.2 mmol/L (ref 3.5–5.1)
Sodium: 140 mmol/L (ref 135–145)
Total Bilirubin: 0.7 mg/dL (ref 0.3–1.2)
Total Protein: 6.7 g/dL (ref 6.5–8.1)

## 2021-11-18 MED ORDER — ONDANSETRON HCL 8 MG PO TABS
8.0000 mg | ORAL_TABLET | Freq: Three times a day (TID) | ORAL | 0 refills | Status: AC | PRN
  Filled 2021-11-18: qty 30, 10d supply, fill #0

## 2021-11-18 MED ORDER — LACOSAMIDE 100 MG PO TABS
100.0000 mg | ORAL_TABLET | Freq: Two times a day (BID) | ORAL | 2 refills | Status: AC
Start: 2021-11-18 — End: ?

## 2021-11-18 MED ORDER — TEMOZOLOMIDE 180 MG PO CAPS
180.0000 mg | ORAL_CAPSULE | Freq: Every day | ORAL | 0 refills | Status: DC
  Filled 2021-11-18: qty 14, 14d supply, fill #0

## 2021-11-18 MED ORDER — TEMOZOLOMIDE 100 MG PO CAPS
200.0000 mg | ORAL_CAPSULE | Freq: Every day | ORAL | 0 refills | Status: DC
  Filled 2021-11-18: qty 10, 5d supply, fill #0

## 2021-11-18 NOTE — Progress Notes (Signed)
[facility] at [place], [place] [address] [phone]   Interval Evaluation  Date of Service: 11/18/21 Patient Name: Todd [name] Patient MRN: [id number] Patient DOB: [birth year] Provider: Ventura Sellers, MD  Identifying Statement:  Todd [name] is a 61 y.o. male with left frontal glioblastoma   Oncologic History: Oncology History  Glioblastoma, IDH-wildtype ([place])  06/22/2021 Surgery   Left frontotemporal craniotomy, resection by Dr. Arnoldo Morale.  Path is GBM IDHwt.   07/18/2021 - 07/18/2021 Chemotherapy   Patient is on Treatment Plan : BRAIN GLIOBLASTOMA Radiation Therapy With Concurrent Temozolomide 75 mg/m2 Daily Followed By Sequential Maintenance Temozolomide x 6-12 cycles       Biomarkers:  MGMT Unknown.  IDH 1/2 Wild type.  EGFR Unknown  TERT Unknown   Interval History: Emmet Messer presents today for follow up, now having completed full course of radiation and Temodar.  Tolerated the chemo very well.  He continues to describe seizure episodes with right hand/arm shaking, followed by imbalance and fall; not necessarily improved with higher dose of Keppra.  Remains active mowing his lawn, outside often.  Not needing the walker.  Keppra at $RemoveB'1000mg'yBcAwhzK$  BID.  H+P (07/18/21) Patient presented to medical attention last month with several weeks of progressive confusion, balance problems, headaches.  In addition, he had at least one full convulsion/seizure event.  CNS imaging done at [facility] ED demonstrated an enhancing left frontal and temporal mass lesion.  He underwent craniotomy and debulking resection with Dr. Arnoldo Morale on 06/22/21; path demonstrated glioblastoma.  Following surgery, he did not experiencing worsening of weakness, gait, or cognition.  Headaches have persisted however, near daily.  No seizure events since prior to surgery.  Currently living at home with his wife, walking independently, and taking care of own  activities of daily living.  He has a chronic speech impediment which is unchanged from prior baseline.  Decadron was dosed at $Remove'4mg'SlTWsIa$  TID and stopped abruptly ~5 days ago without taper.  Medications: Current Outpatient Medications on File Prior to Visit  Medication Sig Dispense Refill   diphenhydrAMINE HCl, Sleep, (ZZZQUIL PO) Take 30 mLs by mouth daily as needed (takes at bedtime for sleep). (Patient not taking: Reported on 11/03/2021)     escitalopram (LEXAPRO) 10 MG tablet Take 1 tablet (10 mg total) by mouth daily. 30 tablet 3   levETIRAcetam (KEPPRA) 500 MG tablet Take 2 tablets (1,000 mg total) by mouth 2 (two) times daily. 120 tablet 1   losartan (COZAAR) 25 MG tablet Take 1 tablet (25 mg total) by mouth daily. 90 tablet 1   metoprolol succinate (TOPROL XL) 50 MG 24 hr tablet Take 1 tablet (50 mg total) by mouth daily. Take with or immediately following a meal. 90 tablet 1   Multiple Vitamins-Minerals (COMPLETE MULTIVITAMIN/MINERAL PO) Take by mouth.     tamsulosin (FLOMAX) 0.4 MG CAPS capsule Take 1 capsule (0.4 mg total) by mouth daily. 30 capsule 0   No current facility-administered medications on file prior to visit.    Allergies: No Known Allergies Past Medical History:  Past Medical History:  Diagnosis Date   Anxiety    CHF (congestive heart failure) ([place])    Depression    Past Surgical History:  Past Surgical History:  Procedure Laterality Date   APPLICATION OF CRANIAL NAVIGATION Left 06/22/2021   Procedure: APPLICATION OF CRANIAL NAVIGATION;  Surgeon: Newman Pies, MD;  Location: [facility];  Service: Neurosurgery;  Laterality: Left;   CRANIOTOMY Left 06/22/2021  Procedure: LEFT CRANIOTOMY FOR TUMOR EXCISION;  Surgeon: Newman Pies, MD;  Location: [facility];  Service: Neurosurgery;  Laterality: Left;   RADIOLOGY WITH ANESTHESIA N/A 06/22/2021   Procedure: MRI BRAIN  WITH ANESTHESIA;  Surgeon: Radiologist, Medication, MD;  Location: [facility];  Service: Radiology;  Laterality: N/A;    Social History:  Social History   Socioeconomic History   Marital status: Married    Spouse name: Not on file   Number of children: Not on file   Years of education: Not on file   Highest education level: Not on file  Occupational History   Not on file  Tobacco Use   Smoking status: Former    Types: E-cigarettes    Start date: 2020   Smokeless tobacco: Never  Vaping Use   Vaping Use: Every day   Substances: Nicotine, Flavoring  Substance and Sexual Activity   Alcohol use: Never   Drug use: Never   Sexual activity: Yes    Birth control/protection: None  Other Topics Concern   Not on file  Social History Narrative   Not on file   Social Determinants of Health   Financial Resource Strain: Medium Risk (10/14/2021)   Overall Financial Resource Strain (CARDIA)    Difficulty of Paying Living Expenses: Somewhat hard  Food Insecurity: No Food Insecurity (10/14/2021)   Hunger Vital Sign    Worried About Running Out of Food in the Last Year: Never true    Ran Out of Food in the Last Year: Never true  Transportation Needs: No Transportation Needs (10/14/2021)   PRAPARE - Hydrologist (Medical): No    Lack of Transportation (Non-Medical): No  Physical Activity: Inactive (10/14/2021)   Exercise Vital Sign    Days of Exercise per Week: 0 days    Minutes of Exercise per Session: 0 min  Stress: Stress Concern Present (10/14/2021)   [place]    Feeling of Stress : Rather much  Social Connections: Moderately Integrated (10/14/2021)   Social Connection and Isolation Panel [NHANES]    Frequency of Communication with Friends and Family: Three times a week    Frequency of Social Gatherings with Friends and Family: Three times a week    Attends Religious Services: 1 to 4 times per year    Active Member of Clubs or Organizations: No    Attends Archivist Meetings: Never    Marital Status:  Married  Human resources officer Violence: Not At Risk (10/14/2021)   Humiliation, Afraid, Rape, and Kick questionnaire    Fear of Current or Ex-Partner: No    Emotionally Abused: No    Physically Abused: No    Sexually Abused: No   Family History:  Family History  Problem Relation Age of Onset   Cancer Mother    Dementia Father     Review of Systems: Constitutional: Doesn't report fevers, chills or abnormal weight loss Eyes: Doesn't report blurriness of vision Ears, nose, mouth, throat, and face: Doesn't report sore throat Respiratory: Doesn't report cough, dyspnea or wheezes Cardiovascular: Doesn't report palpitation, chest discomfort  Gastrointestinal:  Doesn't report nausea, constipation, diarrhea GU: Doesn't report incontinence Skin: Doesn't report skin rashes Neurological: Per HPI Musculoskeletal: Doesn't report joint pain Behavioral/Psych: Doesn't report anxiety  Physical Exam: Vitals:   11/18/21 1023  BP: 112/70  Pulse: 73  Resp: 20  Temp: 97.8 F (36.6 C)  SpO2: 100%    KPS: 80. General: Alert, cooperative, pleasant,  in no acute distress Head: Normal EENT: No conjunctival injection or scleral icterus.  Lungs: Resp effort normal Cardiac: Regular rate Abdomen: Non-distended abdomen Skin: No rashes cyanosis or petechiae. Extremities: No clubbing or edema  Neurologic Exam: Mental Status: Awake, alert, attentive to examiner. Oriented to self and environment. Language is noted for mild dysphasia, chronic disarticulation and stutter. Cranial Nerves: Visual acuity is grossly normal. Visual fields are full. Extra-ocular movements intact. No ptosis. Face is symmetric Motor: Tone and bulk are normal. Power is full in both arms and legs. Reflexes are symmetric, no pathologic reflexes present.  Sensory: Intact to light touch Gait: Normal.   Labs: I have reviewed the data as listed    Component Value Date/Time   NA 139 09/09/2021 0922   K 4.5 09/09/2021 0922   CL 104  09/09/2021 0922   CO2 30 09/09/2021 0922   GLUCOSE 113 (H) 09/09/2021 0922   BUN 22 (H) 09/09/2021 0922   CREATININE 0.90 09/09/2021 0922   CALCIUM 9.1 09/09/2021 0922   PROT 6.5 09/09/2021 0922   ALBUMIN 3.9 09/09/2021 0922   AST 16 09/09/2021 0922   ALT 23 09/09/2021 0922   ALKPHOS 45 09/09/2021 0922   BILITOT 0.8 09/09/2021 0922   GFRNONAA >60 09/09/2021 0922   Lab Results  Component Value Date   WBC 7.9 09/09/2021   NEUTROABS 6.2 09/09/2021   HGB 12.9 (L) 09/09/2021   HCT 40.0 09/09/2021   MCV 95.2 09/09/2021   PLT 287 09/09/2021     Assessment/Plan Glioblastoma, IDH-wildtype ([place])  Kacee Koren is clinically stable today, now having completed cycle #1 adjuvant Temozolomide.    We recommended continuing treatment with cycle #2 Temozolomide, dose increased to 200 mg/m2, on for five days and off for twenty three days in twenty eight day cycles. The patient will have a complete blood count performed on days 21 and 28 of each cycle, and a comprehensive metabolic panel performed on day 28 of each cycle. Labs may need to be performed more often. Zofran will prescribed for home use for nausea/vomiting.   Chemotherapy should be held for the following:  ANC less than 1,000  Platelets less than 100,000  LFT or creatinine greater than 2x ULN  If clinical concerns/contraindications develop  Will add Vimpat $RemoveBe'100mg'gNWVfvdqq$  BID for additional coverage given refractory focal seizures.  Keppra should con't $RemoveBefo'1000mg'YmEoIIqHAIM$  BID.    We ask that Lonald Troiani return to clinic in 1 months prior to cycle #3 with MRI brain for evaluation, or sooner as needed.  All questions were answered. The patient knows to call the clinic with any problems, questions or concerns. No barriers to learning were detected.  The total time spent in the encounter was 30 minutes and more than 50% was on counseling and review of test results   Ventura Sellers, MD Medical Director of Neuro-Oncology [facility] at [place] 11/18/21 9:52 AM

## 2021-11-18 NOTE — Progress Notes (Signed)
DISCONTINUE ON PATHWAY REGIMEN - Neuro ? ? ?  One cycle, concurrent with RT: ?    Temozolomide  ? ?**Always confirm dose/schedule in your pharmacy ordering system** ? ?REASON: Continuation Of Treatment ?PRIOR TREATMENT: [id number]: Radiation Therapy with Concurrent Temozolomide 75 mg/m2 Daily x 6 Weeks, Followed by Adjuvant Temozolomide ?TREATMENT RESPONSE: Stable Disease (SD) ? ?START ON PATHWAY REGIMEN - Neuro ? ? ?  A cycle is every 28 days: ?    Temozolomide  ?    Temozolomide  ? ?**Always confirm dose/schedule in your pharmacy ordering system** ? ?Patient Characteristics: ?Glioma, Glioblastoma, IDH-wildtype, Newly Diagnosed / Treatment Naive, Good Performance Status and/or Younger Patient, MGMT Promoter Unmethylated/Unknown ?Disease Classification: Glioma ?Disease Classification: Glioblastoma, IDH-wildtype ?Disease Status: Newly Diagnosed / Treatment Naive ?Performance Status: Good Performance Status and/or Younger Patient ?MGMT Promoter Methylation Status: Awaiting Test Results ?Intent of Therapy: ?Non-Curative / Palliative Intent, Discussed with Patient ?

## 2021-11-19 ENCOUNTER — Other Ambulatory Visit: Payer: Self-pay

## 2021-11-20 ENCOUNTER — Other Ambulatory Visit: Payer: Self-pay

## 2021-11-22 ENCOUNTER — Ambulatory Visit: Payer: Self-pay | Admitting: Internal Medicine

## 2021-11-25 ENCOUNTER — Other Ambulatory Visit: Payer: Self-pay | Admitting: Pharmacist

## 2021-11-25 ENCOUNTER — Other Ambulatory Visit (HOSPITAL_COMMUNITY): Payer: Self-pay

## 2021-11-25 DIAGNOSIS — C719 Malignant neoplasm of brain, unspecified: Secondary | ICD-10-CM

## 2021-11-25 MED ORDER — TEMOZOLOMIDE 100 MG PO CAPS: 200.0000 mg | ORAL_CAPSULE | Freq: Every day | ORAL | 0 refills | Status: AC

## 2021-11-25 MED ORDER — TEMOZOLOMIDE 180 MG PO CAPS: 180.0000 mg | ORAL_CAPSULE | Freq: Every day | ORAL | 0 refills | Status: AC

## 2021-11-25 NOTE — Progress Notes (Signed)
Patient fills temozolomide at [facility]. Prescription redirected. LVM for wife to let her know.

## 2021-12-20 ENCOUNTER — Ambulatory Visit (HOSPITAL_COMMUNITY): Payer: BLUE CROSS/BLUE SHIELD

## 2021-12-20 ENCOUNTER — Ambulatory Visit
Admission: RE | Admit: 2021-12-20 | Discharge: 2021-12-20 | Disposition: A | Discharge status: Home / Self Care | Payer: BLUE CROSS/BLUE SHIELD | Source: Ambulatory Visit | Attending: Internal Medicine | Admitting: Internal Medicine

## 2021-12-20 DIAGNOSIS — C719 Malignant neoplasm of brain, unspecified: Secondary | ICD-10-CM | POA: Insufficient documentation

## 2021-12-20 MED ORDER — GADOBUTROL 1 MMOL/ML IV SOLN
7.0000 mL | Freq: Once | INTRAVENOUS | Status: AC | PRN
  Administered 2021-12-20: 7 mL via INTRAVENOUS

## 2021-12-23 ENCOUNTER — Other Ambulatory Visit: Payer: Self-pay | Admitting: *Deleted

## 2021-12-23 ENCOUNTER — Inpatient Hospital Stay: Payer: BLUE CROSS/BLUE SHIELD | Attending: Internal Medicine

## 2021-12-23 ENCOUNTER — Other Ambulatory Visit: Payer: Self-pay

## 2021-12-23 ENCOUNTER — Encounter: Payer: Self-pay | Admitting: Internal Medicine

## 2021-12-23 ENCOUNTER — Inpatient Hospital Stay (HOSPITAL_BASED_OUTPATIENT_CLINIC_OR_DEPARTMENT_OTHER): Payer: BLUE CROSS/BLUE SHIELD | Admitting: Internal Medicine

## 2021-12-23 VITALS — BP 103/68 | HR 89 | Temp 97.6°F | Resp 16 | Wt 165.0 lb

## 2021-12-23 DIAGNOSIS — C719 Malignant neoplasm of brain, unspecified: Secondary | ICD-10-CM

## 2021-12-23 DIAGNOSIS — C711 Malignant neoplasm of frontal lobe: Secondary | ICD-10-CM | POA: Diagnosis present

## 2021-12-23 DIAGNOSIS — R569 Unspecified convulsions: Secondary | ICD-10-CM | POA: Insufficient documentation

## 2021-12-23 LAB — CBC WITH DIFFERENTIAL/PLATELET
Abs Immature Granulocytes: 0.01 10*3/uL (ref 0.00–0.07)
Basophils Absolute: 0 10*3/uL (ref 0.0–0.1)
Basophils Relative: 1 %
Eosinophils Absolute: 0.1 10*3/uL (ref 0.0–0.5)
Eosinophils Relative: 3 %
HCT: 40.5 % (ref 39.0–52.0)
Hemoglobin: 13.4 g/dL (ref 13.0–17.0)
Immature Granulocytes: 0 %
Lymphocytes Relative: 16 %
Lymphs Abs: 0.8 10*3/uL (ref 0.7–4.0)
MCH: 30.5 pg (ref 26.0–34.0)
MCHC: 33.1 g/dL (ref 30.0–36.0)
MCV: 92.3 fL (ref 80.0–100.0)
Monocytes Absolute: 0.5 10*3/uL (ref 0.1–1.0)
Monocytes Relative: 10 %
Neutro Abs: 3.5 10*3/uL (ref 1.7–7.7)
Neutrophils Relative %: 70 %
Platelets: 153 10*3/uL (ref 150–400)
RBC: 4.39 MIL/uL (ref 4.22–5.81)
RDW: 12.3 % (ref 11.5–15.5)
WBC: 5 10*3/uL (ref 4.0–10.5)
nRBC: 0 % (ref 0.0–0.2)

## 2021-12-23 LAB — COMPREHENSIVE METABOLIC PANEL
ALT: 13 U/L (ref 0–44)
AST: 15 U/L (ref 15–41)
Albumin: 4.1 g/dL (ref 3.5–5.0)
Alkaline Phosphatase: 51 U/L (ref 38–126)
Anion gap: 5 (ref 5–15)
BUN: 19 mg/dL (ref 6–20)
CO2: 28 mmol/L (ref 22–32)
Calcium: 9 mg/dL (ref 8.9–10.3)
Chloride: 105 mmol/L (ref 98–111)
Creatinine, Ser: 0.8 mg/dL (ref 0.61–1.24)
GFR, Estimated: >60 mL/min (ref >60)
Glucose, Bld: 105 mg/dL — ABNORMAL HIGH (ref 70–99)
Potassium: 4 mmol/L (ref 3.5–5.1)
Sodium: 138 mmol/L (ref 135–145)
Total Bilirubin: 0.7 mg/dL (ref 0.3–1.2)
Total Protein: 7 g/dL (ref 6.5–8.1)

## 2021-12-23 MED ORDER — TEMOZOLOMIDE 180 MG PO CAPS: 180.0000 mg | ORAL_CAPSULE | Freq: Every day | ORAL | 0 refills | Status: AC

## 2021-12-23 MED ORDER — TEMOZOLOMIDE 100 MG PO CAPS: 200.0000 mg | ORAL_CAPSULE | Freq: Every day | ORAL | 0 refills | Status: AC

## 2021-12-23 NOTE — Progress Notes (Signed)
Pt in for follow up, wife reports pt has daily seizures but has developed more violent type seizures, which he has had 3 episodes since 12/06/21.  Pt also has new cough that has been getting more persistent in last month.  Pt does vape daily. Pt and wife plan to relocate back to [place] with daughter in December.

## 2021-12-23 NOTE — Progress Notes (Signed)
[facility] at [place], [place] [address] [phone]   Interval Evaluation  Date of Service: 12/23/21 Patient Name: Todd [name] Patient MRN: [id number] Patient DOB: [birth year] Provider: Ventura Sellers, MD  Identifying Statement:  Todd [name] is a 61 y.o. male with left frontal glioblastoma   Oncologic History: Oncology History  Glioblastoma, IDH-wildtype ([place])  06/22/2021 Surgery   Left frontotemporal craniotomy, resection by Dr. Arnoldo Morale.  Path is GBM IDHwt.   07/18/2021 - 07/18/2021 Chemotherapy   Patient is on Treatment Plan : BRAIN GLIOBLASTOMA Radiation Therapy With Concurrent Temozolomide 75 mg/m2 Daily Followed By Sequential Maintenance Temozolomide x 6-12 cycles     11/21/2021 -  Chemotherapy   Patient is on Treatment Plan : BRAIN GLIOBLASTOMA Consolidation Temozolomide Days 1-5 q28 Days        Biomarkers:  MGMT Unknown.  IDH 1/2 Wild type.  EGFR Unknown  TERT Unknown   Interval History: Todd [name] presents today for follow up, now having completed 2nd cycle of 5-day Temodar.  No issues with chemo.  He continues to describe seizure episodes with right hand/arm shaking, followed by imbalance and fall; frequency has decreased to less than daily since starting the Vimpat in addition to prior Keppra.  He did have one episode of generalized shaking and loss of consciousness, which had not occurred previously.  They are planning to move to [facility] on December 1st, still in process of obtaining an oncology provider up there.  H+P (07/18/21) Patient presented to medical attention last month with several weeks of progressive confusion, balance problems, headaches.  In addition, he had at least one full convulsion/seizure event.  CNS imaging done at [facility] ED demonstrated an enhancing left frontal and temporal mass lesion.  He underwent craniotomy and debulking resection with Dr. Arnoldo Morale on 06/22/21; path  demonstrated glioblastoma.  Following surgery, he did not experiencing worsening of weakness, gait, or cognition.  Headaches have persisted however, near daily.  No seizure events since prior to surgery.  Currently living at home with his wife, walking independently, and taking care of own activities of daily living.  He has a chronic speech impediment which is unchanged from prior baseline.  Decadron was dosed at 28m TID and stopped abruptly ~5 days ago without taper.  Medications: Current Outpatient Medications on File Prior to Visit  Medication Sig Dispense Refill   escitalopram (LEXAPRO) 10 MG tablet Take 1 tablet (10 mg total) by mouth daily. 30 tablet 3   Lacosamide 100 MG TABS Take 1 tablet (100 mg total) by mouth in the morning and at bedtime. 60 tablet 2   levETIRAcetam (KEPPRA) 500 MG tablet Take 2 tablets (1,000 mg total) by mouth 2 (two) times daily. 120 tablet 1   losartan (COZAAR) 25 MG tablet Take 1 tablet (25 mg total) by mouth daily. 90 tablet 1   metoprolol succinate (TOPROL XL) 50 MG 24 hr tablet Take 1 tablet (50 mg total) by mouth daily. Take with or immediately following a meal. 90 tablet 1   tamsulosin (FLOMAX) 0.4 MG CAPS capsule Take 1 capsule (0.4 mg total) by mouth daily. 30 capsule 0   diphenhydrAMINE HCl, Sleep, (ZZZQUIL PO) Take 30 mLs by mouth daily as needed (takes at bedtime for sleep). (Patient not taking: Reported on 11/03/2021)     Multiple Vitamins-Minerals (COMPLETE MULTIVITAMIN/MINERAL PO) Take by mouth.     ondansetron (ZOFRAN) 8 MG tablet Take 1 tablet (8 mg total) by mouth every  8 (eight) hours as needed for nausea or vomiting. 30 tablet 0   temozolomide (TEMODAR) 100 MG capsule Take 2 capsules (200 mg total) by mouth daily. May take on an empty stomach to decrease nausea & vomiting. (Patient not taking: Reported on 12/23/2021) 10 capsule 0   temozolomide (TEMODAR) 180 MG capsule Take 1 capsule (180 mg total) by mouth daily. May take on an empty stomach to  decrease nausea & vomiting. (Patient not taking: Reported on 12/23/2021) 5 capsule 0   No current facility-administered medications on file prior to visit.    Allergies: No Known Allergies Past Medical History:  Past Medical History:  Diagnosis Date   Anxiety    CHF (congestive heart failure) ([place])    Depression    Past Surgical History:  Past Surgical History:  Procedure Laterality Date   APPLICATION OF CRANIAL NAVIGATION Left 06/22/2021   Procedure: APPLICATION OF CRANIAL NAVIGATION;  Surgeon: Newman Pies, MD;  Location: [facility];  Service: Neurosurgery;  Laterality: Left;   CRANIOTOMY Left 06/22/2021   Procedure: LEFT CRANIOTOMY FOR TUMOR EXCISION;  Surgeon: Newman Pies, MD;  Location: [facility];  Service: Neurosurgery;  Laterality: Left;   RADIOLOGY WITH ANESTHESIA N/A 06/22/2021   Procedure: MRI BRAIN  WITH ANESTHESIA;  Surgeon: Radiologist, Medication, MD;  Location: [facility];  Service: Radiology;  Laterality: N/A;   Social History:  Social History   Socioeconomic History   Marital status: Married    Spouse name: Not on file   Number of children: Not on file   Years of education: Not on file   Highest education level: Not on file  Occupational History   Not on file  Tobacco Use   Smoking status: Former    Types: E-cigarettes    Start date: 2020   Smokeless tobacco: Never  Vaping Use   Vaping Use: Every day   Substances: Nicotine, Flavoring  Substance and Sexual Activity   Alcohol use: Never   Drug use: Never   Sexual activity: Yes    Birth control/protection: None  Other Topics Concern   Not on file  Social History Narrative   Not on file   Social Determinants of Health   Financial Resource Strain: Medium Risk (10/14/2021)   Overall Financial Resource Strain (CARDIA)    Difficulty of Paying Living Expenses: Somewhat hard  Food Insecurity: No Food Insecurity (10/14/2021)   Hunger Vital Sign    Worried About Running Out of Food in the Last Year: Never true     Ran Out of Food in the Last Year: Never true  Transportation Needs: No Transportation Needs (10/14/2021)   PRAPARE - Hydrologist (Medical): No    Lack of Transportation (Non-Medical): No  Physical Activity: Inactive (10/14/2021)   Exercise Vital Sign    Days of Exercise per Week: 0 days    Minutes of Exercise per Session: 0 min  Stress: Stress Concern Present (10/14/2021)   [place]    Feeling of Stress : Rather much  Social Connections: Moderately Integrated (10/14/2021)   Social Connection and Isolation Panel [NHANES]    Frequency of Communication with Friends and Family: Three times a week    Frequency of Social Gatherings with Friends and Family: Three times a week    Attends Religious Services: 1 to 4 times per year    Active Member of Clubs or Organizations: No    Attends Archivist Meetings: Never  Marital Status: Married  Human resources officer Violence: Not At Risk (10/14/2021)   Humiliation, Afraid, Rape, and Kick questionnaire    Fear of Current or Ex-Partner: No    Emotionally Abused: No    Physically Abused: No    Sexually Abused: No   Family History:  Family History  Problem Relation Age of Onset   Cancer Mother    Dementia Father     Review of Systems: Constitutional: Doesn't report fevers, chills or abnormal weight loss Eyes: Doesn't report blurriness of vision Ears, nose, mouth, throat, and face: Doesn't report sore throat Respiratory: Doesn't report cough, dyspnea or wheezes Cardiovascular: Doesn't report palpitation, chest discomfort  Gastrointestinal:  Doesn't report nausea, constipation, diarrhea GU: Doesn't report incontinence Skin: Doesn't report skin rashes Neurological: Per HPI Musculoskeletal: Doesn't report joint pain Behavioral/Psych: Doesn't report anxiety  Physical Exam: Vitals:   12/23/21 1000  BP: 103/68  Pulse: 89  Resp: 16  Temp: 97.6 F  (36.4 C)   KPS: 70. General: Alert, cooperative, pleasant, in no acute distress Head: Normal EENT: No conjunctival injection or scleral icterus.  Lungs: Resp effort normal Cardiac: Regular rate Abdomen: Non-distended abdomen Skin: No rashes cyanosis or petechiae. Extremities: No clubbing or edema  Neurologic Exam: Mental Status: Awake, alert, attentive to examiner. Oriented to self and environment. Language is noted for mild dysphasia, chronic disarticulation and stutter.  Age advanced psychomotor slowing. Cranial Nerves: Visual acuity is grossly normal. Visual fields are full. Extra-ocular movements intact. No ptosis. Face is symmetric Motor: Tone and bulk are normal. Power is full in both arms and legs. Reflexes are symmetric, no pathologic reflexes present.  Sensory: Intact to light touch Gait: Mild dystaxia   Labs: I have reviewed the data as listed    Component Value Date/Time   NA 140 11/18/2021 1012   K 4.2 11/18/2021 1012   CL 107 11/18/2021 1012   CO2 28 11/18/2021 1012   GLUCOSE 77 11/18/2021 1012   BUN 17 11/18/2021 1012   CREATININE 0.81 11/18/2021 1012   CALCIUM 8.8 (L) 11/18/2021 1012   PROT 6.7 11/18/2021 1012   ALBUMIN 4.0 11/18/2021 1012   AST 16 11/18/2021 1012   ALT 13 11/18/2021 1012   ALKPHOS 51 11/18/2021 1012   BILITOT 0.7 11/18/2021 1012   GFRNONAA >60 11/18/2021 1012   Lab Results  Component Value Date   WBC 5.0 12/23/2021   NEUTROABS 3.5 12/23/2021   HGB 13.4 12/23/2021   HCT 40.5 12/23/2021   MCV 92.3 12/23/2021   PLT 153 12/23/2021    Imaging:  [place] Clinician Interpretation: I have personally reviewed the CNS images as listed.  My interpretation, in the context of the patient's clinical presentation, is treatment effect vs true progression  No results found.  Assessment/Plan Glioblastoma, IDH-wildtype ([place])  Quinn Quam is clinically stable today from focal standpoint, now having completed cycle #2 adjuvant Temozolomide.  He  does continue to experience breakthrough seizures, despite improvement in frequency since addition of vimpat.  Unfortunately, MRI brain demonstrates several foci of increased nodular enhancement, consistent with progression of disease (favored) vs post-RT inflammatory change.  We discussed goals of care, options moving forward.  We discussed initiating dexamethasone to treat inflammatory change, if present.  Would also proceed with one additional cycle of Temozolomide, then repeat MRI brain in 1 month rather than 2 months.  The other option would be transition to second line therapy, CCNU 73m/m2 and avastin 129mm2 IV q2 weeks.   At this time, given circumstances surrounding move,  logistics, care transition, he would prefer to start the steroids and try an additional cycle (#3) of 5-day Temodar at 236m/m2.    Decadron will be started at 438mdaily; this can also act as an addition to his AED regimen given inflammatory conditions, mesial temporal focus of progression.   Chemotherapy should be held for the following:  ANC less than 1,000  Platelets less than 100,000  LFT or creatinine greater than 2x ULN  If clinical concerns/contraindications develop  Will otherwise con't Vimpat 10054mID and Keppra 1000m74mD for seizure prevention.    We also discussed and patient consented for additional tumor profiling and sequencing through [facility] tumor profiling could help identify actionable mutation for targeted therapy and lead to direct clinical benefit.     MRI brain should be repeated in 1 month, ideally first week of December.   We ask that StevAli Lowe[place] care with [place] are more than happy to share records or discuss any aspect of case over the phone to aid with transition of care.  All questions were answered. The patient knows to call the clinic with any problems, questions or concerns. No barriers to learning were detected.  The  total time spent in the encounter was 40 minutes and more than 50% was on counseling and review of test results   ZachVentura Sellers Medical Director of Neuro-Oncology [facility]10/23 9:58 AM

## 2021-12-27 ENCOUNTER — Other Ambulatory Visit: Payer: Self-pay | Admitting: Internal Medicine

## 2021-12-27 ENCOUNTER — Encounter: Payer: Self-pay | Admitting: Internal Medicine

## 2021-12-27 MED ORDER — DEXAMETHASONE 4 MG PO TABS
4.0000 mg | ORAL_TABLET | Freq: Every day | ORAL | 1 refills | Status: AC
Start: 2021-12-27 — End: ?

## 2022-01-01 ENCOUNTER — Other Ambulatory Visit: Payer: Self-pay | Admitting: Internal Medicine

## 2022-01-02 ENCOUNTER — Other Ambulatory Visit: Payer: Self-pay | Admitting: Internal Medicine

## 2022-01-02 DIAGNOSIS — C719 Malignant neoplasm of brain, unspecified: Secondary | ICD-10-CM

## 2022-01-02 MED ORDER — LEVETIRACETAM 500 MG PO TABS: 1000.0000 mg | ORAL_TABLET | Freq: Two times a day (BID) | ORAL | 0 refills | Status: AC

## 2022-01-04 NOTE — Telephone Encounter (Signed)
Oncology referral faxed on 01/04/22

## 2022-01-09 ENCOUNTER — Other Ambulatory Visit: Payer: Self-pay | Admitting: Family Medicine

## 2022-01-09 DIAGNOSIS — I509 Heart failure, unspecified: Secondary | ICD-10-CM

## 2022-01-09 DIAGNOSIS — I10 Essential (primary) hypertension: Secondary | ICD-10-CM

## 2022-01-12 ENCOUNTER — Telehealth: Payer: Self-pay | Admitting: *Deleted

## 2022-01-12 NOTE — Telephone Encounter (Signed)
Records faxed to Dr Rachael Fee  with [facility].  [phone] and discs are fedex

## 2022-02-02 ENCOUNTER — Other Ambulatory Visit: Payer: Self-pay

## 2022-02-02 ENCOUNTER — Telehealth: Payer: Self-pay | Admitting: Internal Medicine

## 2022-02-02 DIAGNOSIS — F321 Major depressive disorder, single episode, moderate: Secondary | ICD-10-CM

## 2022-02-02 MED ORDER — ESCITALOPRAM OXALATE 10 MG PO TABS: 10.0000 mg | ORAL_TABLET | Freq: Every day | ORAL | 3 refills | Status: AC

## 2022-02-02 NOTE — Telephone Encounter (Signed)
Refill sent.

## 2022-02-02 NOTE — Telephone Encounter (Signed)
Patient is in the process of moving to [place].  Patient is currently in [place] and needs refills    escitalopram (LEXAPRO) 10 MG tablet    [facility]  [place], [place]

## 2022-03-25 ENCOUNTER — Encounter: Payer: Self-pay | Admitting: Internal Medicine

## 2022-12-22 ENCOUNTER — Telehealth: Payer: Self-pay | Admitting: Internal Medicine

## 2022-12-22 NOTE — Telephone Encounter (Signed)
Wife called to report patient passed away in  [place] in 2022/05/04.

## 2023-03-28 NOTE — Telephone Encounter (Signed)
Msg sent to sch to mark pt deceased in system
